# Patient Record
Sex: Female | Born: 1960 | Race: Black or African American | Hispanic: No | Marital: Married | Smoking: Current every day smoker
Health system: Southern US, Community
[De-identification: ages and names within clinical notes are randomized; demographics above are authoritative.]

## PROBLEM LIST (undated history)

## (undated) DIAGNOSIS — I10 Essential (primary) hypertension: Secondary | ICD-10-CM

## (undated) DIAGNOSIS — G8929 Other chronic pain: Secondary | ICD-10-CM

## (undated) DIAGNOSIS — F419 Anxiety disorder, unspecified: Secondary | ICD-10-CM

## (undated) DIAGNOSIS — E7800 Pure hypercholesterolemia, unspecified: Secondary | ICD-10-CM

## (undated) HISTORY — PX: NO PAST SURGERIES: SHX2092

---

## 2006-03-22 ENCOUNTER — Emergency Department (HOSPITAL_COMMUNITY): Admission: EM | Admit: 2006-03-22 | Discharge: 2006-03-22 | Payer: Self-pay | Admitting: Emergency Medicine

## 2006-08-05 ENCOUNTER — Emergency Department (HOSPITAL_COMMUNITY): Admission: EM | Admit: 2006-08-05 | Discharge: 2006-08-05 | Payer: Self-pay | Admitting: Emergency Medicine

## 2006-11-27 ENCOUNTER — Emergency Department (HOSPITAL_COMMUNITY): Admission: EM | Admit: 2006-11-27 | Discharge: 2006-11-27 | Payer: Self-pay | Admitting: Emergency Medicine

## 2009-02-12 ENCOUNTER — Emergency Department (HOSPITAL_COMMUNITY): Admission: EM | Admit: 2009-02-12 | Discharge: 2009-02-12 | Payer: Self-pay | Admitting: Emergency Medicine

## 2009-05-14 ENCOUNTER — Emergency Department (HOSPITAL_COMMUNITY): Admission: EM | Admit: 2009-05-14 | Discharge: 2009-05-15 | Payer: Self-pay | Admitting: Emergency Medicine

## 2010-07-08 ENCOUNTER — Encounter: Admission: RE | Admit: 2010-07-08 | Discharge: 2010-07-08 | Payer: Self-pay | Admitting: Family Medicine

## 2010-08-10 ENCOUNTER — Encounter: Admission: RE | Admit: 2010-08-10 | Discharge: 2010-08-10 | Payer: Self-pay | Admitting: Family Medicine

## 2010-11-07 ENCOUNTER — Encounter: Payer: Self-pay | Admitting: Family Medicine

## 2011-01-23 LAB — COMPREHENSIVE METABOLIC PANEL
Albumin: 3.7 g/dL (ref 3.5–5.2)
Alkaline Phosphatase: 60 U/L (ref 39–117)
BUN: 5 mg/dL — ABNORMAL LOW (ref 6–23)
CO2: 23 mEq/L (ref 19–32)
Chloride: 110 mEq/L (ref 96–112)
Creatinine, Ser: 0.9 mg/dL (ref 0.4–1.2)
GFR calc non Af Amer: >60 mL/min (ref >60)
Potassium: 3.4 mEq/L — ABNORMAL LOW (ref 3.5–5.1)
Total Bilirubin: 0.5 mg/dL (ref 0.3–1.2)

## 2011-01-23 LAB — CBC
HCT: 37.7 % (ref 36.0–46.0)
Hemoglobin: 12.4 g/dL (ref 12.0–15.0)
MCV: 89.9 fL (ref 78.0–100.0)
Platelets: 314 10*3/uL (ref 150–400)
RBC: 4.19 MIL/uL (ref 3.87–5.11)
WBC: 7.9 10*3/uL (ref 4.0–10.5)

## 2011-01-23 LAB — PREGNANCY, URINE: Preg Test, Ur: NEGATIVE

## 2011-01-23 LAB — DIFFERENTIAL
Basophils Absolute: 0 10*3/uL (ref 0.0–0.1)
Basophils Relative: 1 % (ref 0–1)
Eosinophils Relative: 1 % (ref 0–5)
Lymphocytes Relative: 25 % (ref 12–46)
Monocytes Absolute: 0.7 10*3/uL (ref 0.1–1.0)
Neutro Abs: 5.1 10*3/uL (ref 1.7–7.7)

## 2011-01-23 LAB — URINALYSIS, ROUTINE W REFLEX MICROSCOPIC
Bilirubin Urine: NEGATIVE
Ketones, ur: NEGATIVE mg/dL
Nitrite: NEGATIVE
Protein, ur: NEGATIVE mg/dL
Urobilinogen, UA: 0.2 mg/dL (ref 0.0–1.0)
pH: 5.5 (ref 5.0–8.0)

## 2011-01-23 LAB — GC/CHLAMYDIA PROBE AMP, GENITAL: Chlamydia, DNA Probe: NEGATIVE

## 2011-01-23 LAB — URINE MICROSCOPIC-ADD ON

## 2011-01-23 LAB — LIPASE, BLOOD: Lipase: 17 U/L (ref 11–59)

## 2011-03-05 ENCOUNTER — Emergency Department (HOSPITAL_COMMUNITY)
Admission: EM | Admit: 2011-03-05 | Discharge: 2011-03-05 | Discharge status: Against Medical Advice | Payer: Medicaid Other | Attending: Emergency Medicine | Admitting: Emergency Medicine

## 2011-03-05 DIAGNOSIS — R252 Cramp and spasm: Secondary | ICD-10-CM | POA: Insufficient documentation

## 2011-07-04 ENCOUNTER — Other Ambulatory Visit: Payer: Self-pay | Admitting: Family Medicine

## 2011-07-04 DIAGNOSIS — Z1231 Encounter for screening mammogram for malignant neoplasm of breast: Secondary | ICD-10-CM

## 2011-08-12 ENCOUNTER — Ambulatory Visit: Payer: Self-pay

## 2011-08-15 ENCOUNTER — Ambulatory Visit: Payer: Self-pay

## 2011-11-04 ENCOUNTER — Ambulatory Visit: Payer: Medicaid Other

## 2012-03-28 ENCOUNTER — Encounter (HOSPITAL_COMMUNITY): Payer: Self-pay | Admitting: Emergency Medicine

## 2012-03-28 ENCOUNTER — Emergency Department (HOSPITAL_COMMUNITY)
Admission: EM | Admit: 2012-03-28 | Discharge: 2012-03-28 | Disposition: A | Discharge status: Home / Self Care | Payer: Medicaid Other | Attending: Emergency Medicine | Admitting: Emergency Medicine

## 2012-03-28 DIAGNOSIS — Y92009 Unspecified place in unspecified non-institutional (private) residence as the place of occurrence of the external cause: Secondary | ICD-10-CM | POA: Insufficient documentation

## 2012-03-28 DIAGNOSIS — S71109A Unspecified open wound, unspecified thigh, initial encounter: Secondary | ICD-10-CM | POA: Insufficient documentation

## 2012-03-28 DIAGNOSIS — F17200 Nicotine dependence, unspecified, uncomplicated: Secondary | ICD-10-CM | POA: Insufficient documentation

## 2012-03-28 DIAGNOSIS — W540XXA Bitten by dog, initial encounter: Secondary | ICD-10-CM | POA: Insufficient documentation

## 2012-03-28 DIAGNOSIS — Z23 Encounter for immunization: Secondary | ICD-10-CM | POA: Insufficient documentation

## 2012-03-28 DIAGNOSIS — S71009A Unspecified open wound, unspecified hip, initial encounter: Secondary | ICD-10-CM | POA: Insufficient documentation

## 2012-03-28 MED ORDER — AMOXICILLIN-POT CLAVULANATE 875-125 MG PO TABS
1.0000 | ORAL_TABLET | ORAL | Status: AC
  Administered 2012-03-28: 1 via ORAL
  Filled 2012-03-28: qty 1

## 2012-03-28 MED ORDER — TETANUS-DIPHTH-ACELL PERTUSSIS 5-2.5-18.5 LF-MCG/0.5 IM SUSP
0.5000 mL | Freq: Once | INTRAMUSCULAR | Status: AC
  Administered 2012-03-28: 0.5 mL via INTRAMUSCULAR
  Filled 2012-03-28: qty 0.5

## 2012-03-28 MED ORDER — AMOXICILLIN-POT CLAVULANATE 875-125 MG PO TABS: 1.0000 | ORAL_TABLET | Freq: Two times a day (BID) | ORAL | Status: AC

## 2012-03-28 NOTE — ED Provider Notes (Signed)
History   This chart was scribed for Glynn Octave, MD by Charolett Bumpers . The patient was seen in room STRE6/STRE6.    CSN: [id number]  Arrival date & time 03/28/12  1031   First MD Initiated Contact with Patient 03/28/12 1059      Chief Complaint  Patient presents with  . Animal Bite    (Consider location/radiation/quality/duration/timing/severity/associated sxs/prior treatment) HPI Misty Manning is a 51 y.o. female who presents to the Emergency Department complaining of constant, mild animal bite since 1:00 am this morning. Patient states that she stepped on her sleeping dog when he bit her. Patient denies any associated symptoms. Patient states that the dog is hers. Patient states that she believes that the dog had immunizations as a puppy. Patient denies any fever or vomiting. Last tetanus is unknown    History reviewed. No pertinent past medical history.  History reviewed. No pertinent past surgical history.  No family history on file.  History  Substance Use Topics  . Smoking status: Current Everyday Smoker    Types: Cigarettes  . Smokeless tobacco: Not on file  . Alcohol Use: Not on file    OB History    Grav Para Term Preterm Abortions TAB SAB Ect Mult Living                  Review of Systems  Constitutional: Negative for fever and chills.  Respiratory: Negative for shortness of breath.   Gastrointestinal: Negative for nausea and vomiting.  Skin: Positive for wound.  Neurological: Negative for weakness.  All other systems reviewed and are negative.    Allergies  Review of patient's allergies indicates no known allergies.  Home Medications   Current Outpatient Rx  Name Route Sig Dispense Refill  . ALPRAZOLAM 1 MG PO TABS Oral Take 1 mg by mouth 3 (three) times daily.    . CYCLOBENZAPRINE HCL 10 MG PO TABS Oral Take 10 mg by mouth 3 (three) times daily.    Marland Kitchen LISINOPRIL 10 MG PO TABS Oral Take 10 mg by mouth daily.    .  OXYCODONE-ACETAMINOPHEN 10-325 MG PO TABS Oral Take 1 tablet by mouth 2 (two) times daily as needed. For pain      BP 123/74  Pulse 82  Temp 98 F (36.7 C) (Oral)  Resp 18  SpO2 98%  Physical Exam  Nursing note and vitals reviewed. Constitutional: She is oriented to person, place, and time. She appears well-developed and well-nourished. No distress.  HENT:  Head: Normocephalic and atraumatic.  Eyes: EOM are normal.  Neck: Neck supple. No tracheal deviation present.  Cardiovascular: Normal rate.   Pulmonary/Chest: Effort normal. No respiratory distress.  Musculoskeletal: Normal range of motion.  Neurological: She is alert and oriented to person, place, and time.  Skin: Skin is warm and dry.       Tiny puncture to left medial thigh with surrounding erythema.   Psychiatric: She has a normal mood and affect. Her behavior is normal.    ED Course  Procedures (including critical care time)  DIAGNOSTIC STUDIES: Oxygen Saturation is 98% on room air, normal by my interpretation.    COORDINATION OF CARE:  1108: Discussed planned course of treatment with the patient, who is agreeable at this time.     Labs Reviewed - No data to display No results found.   No diagnosis found.    MDM  Dog bite to medial L thigh by own dog.  Dog had shots as puppy but  not sure after that.  Tetanus, wound care, abx  Discussed extremely low risk of rabies with patient given [employer] domestic dog.  She declines rabies prophylaxis.   I personally performed the services described in this documentation, which was scribed in my presence.  The recorded information has been reviewed and considered.       Glynn Octave, MD 03/28/12 1121

## 2012-03-28 NOTE — ED Notes (Signed)
Small puncture wound to left inner thigh after yorkie bit pt in middle of night. No bleeding noted. Pt states dog had vaccines when it was little, but has not taken it to vet in 3-4 years. MD at bedside, pt does not want to have rabies vaccine. Pt also concerned about bone in left shoulder "sticking out". Full range of motion, no pain.

## 2012-03-28 NOTE — Discharge Instructions (Signed)
Animal Bite  An animal bite can result in a scratch on the skin, deep open cut, puncture of the skin, crush injury, or tearing away of the skin or a body part. Dogs are responsible for most animal bites. Children are bitten more often than adults. An animal bite can range from very mild to more serious. A small bite from your house pet is no cause for alarm. However, some animal bites can become infected or injure a bone or other tissue. You must seek medical care if:  · The skin is broken and bleeding does not slow down or stop after 15 minutes.  · The puncture is deep and difficult to clean (such as a cat bite).  · Pain, warmth, redness, or pus develops around the wound.  · The bite is from a stray animal or rodent. There may be a risk of rabies infection.  · The bite is from a snake, raccoon, skunk, fox, coyote, or bat. There may be a risk of rabies infection.  · The person bitten has a chronic illness such as diabetes, liver disease, or cancer, or the person takes medicine that lowers the immune system.  · There is concern about the location and severity of the bite.  It is important to clean and protect an animal bite wound right away to prevent infection. Follow these steps:  · Clean the wound with plenty of water and soap.  · Apply an antibiotic cream.  · Apply gentle pressure over the wound with a clean towel or gauze to slow or stop bleeding.  · Elevate the affected area above the heart to help stop any bleeding.  · Seek medical care. Getting medical care within 8 hours of the animal bite leads to the best possible outcome.  DIAGNOSIS   Your caregiver will most likely:  · Take a detailed history of the animal and the bite injury.  · Perform a wound exam.  · Take your medical history.  Blood tests or X-rays may be performed. Sometimes, infected bite wounds are cultured and sent to a lab to identify the infectious bacteria.   TREATMENT   Medical treatment will depend on the location and type of animal bite as  well as the patient's medical history. Treatment may include:  · Wound care, such as cleaning and flushing the wound with saline solution, bandaging, and elevating the affected area.  · Antibiotics.  · Tetanus immunization.  · Rabies immunization.  · Leaving the wound open to heal. This is often done with animal bites, due to the high risk of infection. However, in certain cases, wound closure with stitches, wound adhesive, skin adhesive strips, or staples may be used.   Infected bites that are left untreated may require intravenous (IV) antibiotics and surgical treatment in the hospital.  HOME CARE INSTRUCTIONS  · Follow your caregiver's instructions for wound care.  · Take all medicines as directed.  · If your caregiver prescribes antibiotics, take them as directed. Finish them even if you start to feel better.  · Follow up with your caregiver for further exams or immunizations as directed.  You may need a tetanus shot if:  · You cannot remember when you had your last tetanus shot.  · You have never had a tetanus shot.  · The injury broke your skin.  If you get a tetanus shot, your arm may swell, get red, and feel warm to the touch. This is common and not a problem. If you need a tetanus   shot and you choose not to have one, there is a rare chance of getting tetanus. Sickness from tetanus can be serious.  SEEK MEDICAL CARE IF:  · You notice warmth, redness, soreness, swelling, pus discharge, or a bad smell coming from the wound.  · You have a red line on the skin coming from the wound.  · You have a fever, chills, or a general ill feeling.  · You have nausea or vomiting.  · You have continued or worsening pain.  · You have trouble moving the injured part.  · You have other questions or concerns.  MAKE SURE YOU:  · Understand these instructions.  · Will watch your condition.  · Will get help right away if you are not doing well or get worse.  Document Released: 06/21/2011 Document Revised: 09/22/2011 Document  Reviewed: 06/21/2011  [employer]® Patient Information ©2012 [employer].

## 2012-03-28 NOTE — ED Notes (Signed)
Pt. Stated, i accidentally stepped on my dog during the night and the dog bit me on the inside of lt thigh.  Dog has not had any shots

## 2012-04-11 ENCOUNTER — Encounter (HOSPITAL_COMMUNITY): Payer: Self-pay

## 2012-04-11 ENCOUNTER — Emergency Department (HOSPITAL_COMMUNITY)
Admission: EM | Admit: 2012-04-11 | Discharge: 2012-04-11 | Disposition: A | Discharge status: Home / Self Care | Payer: Medicaid Other | Source: Home / Self Care

## 2012-04-11 DIAGNOSIS — T63481A Toxic effect of venom of other arthropod, accidental (unintentional), initial encounter: Secondary | ICD-10-CM

## 2012-04-11 DIAGNOSIS — T6391XA Toxic effect of contact with unspecified venomous animal, accidental (unintentional), initial encounter: Secondary | ICD-10-CM

## 2012-04-11 MED ORDER — KETOTIFEN FUMARATE 0.025 % OP SOLN: 1.0000 [drp] | Freq: Two times a day (BID) | OPHTHALMIC | Status: AC

## 2012-04-11 MED ORDER — POLYETHYL GLYCOL-PROPYL GLYCOL 0.4-0.3 % OP GEL: 1.0000 "application " | Freq: Four times a day (QID) | OPHTHALMIC | Status: DC

## 2012-04-11 MED ORDER — TETRACAINE HCL 0.5 % OP SOLN
1.0000 [drp] | Freq: Once | OPHTHALMIC | Status: AC
  Administered 2012-04-11: 1 [drp] via OPHTHALMIC

## 2012-04-11 MED ORDER — PREDNISONE (PAK) 10 MG PO TABS: 50.0000 mg | ORAL_TABLET | Freq: Every day | ORAL | Status: AC

## 2012-04-11 MED ORDER — TETRACAINE HCL 0.5 % OP SOLN
OPHTHALMIC | Status: AC
  Filled 2012-04-11: qty 2
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
## 2012-04-11 NOTE — ED Notes (Signed)
C/o stung by bee 2 days ago; not getting better as quickly as anticipated

## 2012-04-11 NOTE — Discharge Instructions (Signed)
Bee, Wasp, or Hornet Sting   Your caregiver has diagnosed you as having an insect sting. An insect sting appears as a red lump in the skin that sometimes has a tiny hole in the center, or it may have a stinger in the center of the wound. The most common stings are from wasps, hornets and bees.   Individuals have different reactions to insect stings.   A normal reaction may cause pain, swelling, and redness around the sting site.   A localized allergic reaction may cause swelling and redness that extends beyond the sting site.   A large local reaction may continue to develop over the next 12 to 36 hours.   On occasion, the reactions can be severe (anaphylactic reaction). An anaphylactic reaction may cause wheezing; difficulty breathing; chest pain; fainting; raised, itchy, red patches on the skin; a sick feeling to your stomach (nausea); vomiting; cramping; or diarrhea. If you have had an anaphylactic reaction to an insect sting in the past, you are more likely to have one again.   HOME CARE INSTRUCTIONS   With bee stings, a small sac of poison is left in the wound. Brushing across this with something such as a credit card, or anything similar, will help remove this and decrease the amount of the reaction. This same procedure will not help a wasp sting as they do not leave behind a stinger and poison sac.   Apply a cold compress for 10 to 20 minutes every hour for 1 to 2 days, depending on severity, to reduce swelling and itching.   To lessen pain, a paste made of water and baking soda may be rubbed on the bite or sting and left on for 5 minutes.   To relieve itching and swelling, you may use take medication or apply medicated creams or lotions as directed.   Only take over-the-counter or prescription medicines for pain, discomfort, or fever as directed by your caregiver.   Wash the sting site daily with soap and water. Apply antibiotic ointment on the sting site as directed.   If you suffered a severe reaction:   If  you did not require hospitalization, an adult will need to stay with you for 24 hours in case the symptoms return.   You may need to wear a medical bracelet or necklace stating the allergy.   You and your family need to learn when and how to use an anaphylaxis kit or epinephrine injection.   If you have had a severe reaction before, always carry your anaphylaxis kit with you.   SEEK MEDICAL CARE IF:   None of the above helps within 2 to 3 days.   The area becomes red, warm, tender, and swollen beyond the area of the bite or sting.   You have an oral temperature above 102° F (38.9° C).   SEEK IMMEDIATE MEDICAL CARE IF:   You have symptoms of an allergic reaction which are:   Wheezing.   Difficulty breathing.   Chest pain.   Lightheadedness or fainting.   Itchy, raised, red patches on the skin.   Nausea, vomiting, cramping or diarrhea.   ANY OF THESE SYMPTOMS MAY REPRESENT A SERIOUS PROBLEM THAT IS AN EMERGENCY. Do not wait to see if the symptoms will go away. Get medical help right away. Call your local emergency services (911 in [place] DO NOT drive yourself to the hospital.   MAKE SURE YOU:   Understand these instructions.   Will watch your condition.     Will get help right away if you are not doing well or get worse.   Document Released: 10/03/2005 Document Revised: 09/22/2011 Document Reviewed: 03/20/2010   [employer]® Patient Information ©2012 [employer].

## 2012-04-26 ENCOUNTER — Encounter (HOSPITAL_COMMUNITY): Payer: Self-pay | Admitting: Emergency Medicine

## 2012-04-26 ENCOUNTER — Emergency Department (HOSPITAL_COMMUNITY)
Admission: EM | Admit: 2012-04-26 | Discharge: 2012-04-26 | Disposition: A | Discharge status: Home / Self Care | Payer: Medicaid Other | Attending: Emergency Medicine | Admitting: Emergency Medicine

## 2012-04-26 DIAGNOSIS — M79609 Pain in unspecified limb: Secondary | ICD-10-CM | POA: Insufficient documentation

## 2012-04-26 DIAGNOSIS — F17200 Nicotine dependence, unspecified, uncomplicated: Secondary | ICD-10-CM | POA: Insufficient documentation

## 2012-04-26 DIAGNOSIS — M79641 Pain in right hand: Secondary | ICD-10-CM

## 2012-04-26 MED ORDER — HYDROMORPHONE HCL PF 1 MG/ML IJ SOLN
1.0000 mg | Freq: Once | INTRAMUSCULAR | Status: AC
  Administered 2012-04-26: 1 mg via INTRAMUSCULAR
  Filled 2012-04-26: qty 1

## 2012-04-26 MED ORDER — NAPROXEN 500 MG PO TABS: 500.0000 mg | ORAL_TABLET | Freq: Two times a day (BID) | ORAL | Status: AC | PRN

## 2012-04-26 MED ORDER — OXYCODONE-ACETAMINOPHEN 5-325 MG PO TABS: 1.0000 | ORAL_TABLET | ORAL | Status: AC | PRN

## 2012-04-26 MED ORDER — IBUPROFEN 400 MG PO TABS
400.0000 mg | ORAL_TABLET | Freq: Once | ORAL | Status: AC
  Administered 2012-04-26: 400 mg via ORAL
  Filled 2012-04-26: qty 1

## 2012-04-26 MED ORDER — DIAZEPAM 5 MG PO TABS
5.0000 mg | ORAL_TABLET | Freq: Once | ORAL | Status: AC
  Administered 2012-04-26: 5 mg via ORAL
  Filled 2012-04-26: qty 1

## 2012-04-26 NOTE — ED Provider Notes (Signed)
History   This chart was scribed for Raeford Razor, MD by Charolett Bumpers . The patient was seen in room TR05C/TR05C.    CSN: [id number]  Arrival date & time 04/26/12  1419   First MD Initiated Contact with Patient 04/26/12 1448      Chief Complaint  Patient presents with  . Wrist Pain    (Consider location/radiation/quality/duration/timing/severity/associated sxs/prior treatment) HPI Misty Manning is a 51 y.o. female who presents to the Emergency Department complaining of constant Last night, pt states she was sleeping when she noticed the pain. Patient reports associated swelling. Patient states that pain is aggravated with opening and closing her hand. Patient denies any recent injuries. Patient reports a h/o HTN, but denies any h/o diabetes. Pt denies any h/o injury to the right hand. 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 Patient states that she is left handed. Pt denies taking any medications for pain. Patient denies any h/o similar symptoms. Pt denies any other injuries or complaints of pain.    History reviewed. No pertinent past medical history.  History reviewed. No pertinent past surgical history.  History reviewed. No pertinent family history.  History  Substance Use Topics  . Smoking status: Current Everyday Smoker    Types: Cigarettes  . Smokeless tobacco: Not on file  . Alcohol Use: 0.6 oz/week    1 Cans of beer per week    OB History    Grav Para Term Preterm Abortions TAB SAB Ect Mult Living                  Review of Systems  Musculoskeletal: Positive for joint swelling and arthralgias.    Allergies  Review of patient's allergies indicates no known allergies.  Home Medications   Current Outpatient Rx  Name Route Sig Dispense Refill  . ALPRAZOLAM 1 MG PO TABS Oral Take 1 mg by mouth 3 (three) times daily.    . CYCLOBENZAPRINE HCL 10 MG PO TABS Oral Take 10 mg by mouth 3 (three) times daily.    Marland Kitchen LISINOPRIL 10  MG PO TABS Oral Take 10 mg by mouth daily.    . OXYCODONE-ACETAMINOPHEN 10-325 MG PO TABS Oral Take 1 tablet by mouth 2 (two) times daily as needed. For pain    . POLYETHYL GLYCOL-PROPYL GLYCOL 0.4-0.3 % OP GEL Ophthalmic Apply 1 application to eye 4 (four) times daily. 15 mL 0    BP 106/63  Pulse 77  Temp 98.1 F (36.7 C) (Oral)  Resp 18  SpO2 97%  Physical Exam  Nursing note and vitals reviewed. Constitutional: She is oriented to person, place, and time. She appears well-developed and well-nourished. No distress.  HENT:  Head: Normocephalic and atraumatic.  Eyes: EOM are normal.  Neck: Normal range of motion. Neck supple. No tracheal deviation present.  Cardiovascular: Normal rate, regular rhythm and normal heart sounds.   No murmur heard.      Good ulnar and radial pulses of right arm.   Pulmonary/Chest: Effort normal and breath sounds normal. No respiratory distress. She has no wheezes.  Musculoskeletal: Normal range of motion. She exhibits tenderness.       Right hand symmetric as compared to left. No external signs of trauma. Mild tenderness of the palmar aspect of mid right hand. Increased pain with both extension and flexion. No tenderness of flexor sheaths.   Neurological: She is alert and oriented to person, place, and time.  Neurovascularly intact. Mid ulnar and radial nerves intact.   Skin: Skin is warm and dry.       No increased skin warmth. No skin lesions noted.   Psychiatric: She has a normal mood and affect. Her behavior is normal.    ED Course  Procedures (including critical care time)  DIAGNOSTIC STUDIES: Oxygen Saturation is 97% on room air, normal by my interpretation.    COORDINATION OF CARE:  1454: Discussed planned course of treatment with the patient, who is agreeable at this time. Discussed the likelihood of tendontitis Discussed strict return precautions and planned d/c. Discussed f/u with a hand surgeon if symptoms persist. Patient is  agreeable.  1500: Medication Orders: Diazepam (Valium) tablet 5 mg-once; Ibuprofen (Advil, Motrin) tablet 400 mg-once; Hydromorphone (Dilaudid) injection 1 mg-once.    Labs Reviewed - No data to display No results found.   1. Hand pain, right       MDM  51 year old left-hand-dominant female with atraumatic right hand pain. Patient has mild diffuse tenderness over palmar fascia. No swelling noted. No concerning skin changes. No history of recent trauma or remote. Patient is afebrile and well appearing. Patient denies history of diabetes or other immunocompromising states. No significant increase in pain with flexion or extension of the digits. No tenderness along the flexor surfaces of her digits. No swelling noted. Good radial and ulnar pulses and well perfused distally.  This may be a palmar fasciitis or tendinitis. Consider infectious etiology but doubt at this time. Will treat symptomatically. Return precautions were discussed. Patient understands the need for reevaluation for fevers, chills, increasing pain or redness or anything else concerning to her. Hand surgery referral provided if symptoms worsen or fail to improve.    I personally preformed the services scribed in my presence. The recorded information has been reviewed and considered. Raeford Razor, MD.         Raeford Razor, MD 04/26/12 [phone]

## 2012-04-26 NOTE — ED Notes (Signed)
Pt c/o right wrist pain starting today; pt denies obvious injury; pt sts painful to move; CMS intact

## 2012-07-23 ENCOUNTER — Other Ambulatory Visit: Payer: Self-pay | Admitting: Internal Medicine

## 2012-07-23 DIAGNOSIS — Z1231 Encounter for screening mammogram for malignant neoplasm of breast: Secondary | ICD-10-CM

## 2012-08-27 ENCOUNTER — Ambulatory Visit: Payer: Medicaid Other

## 2012-09-19 ENCOUNTER — Ambulatory Visit: Payer: Medicaid Other

## 2012-10-31 ENCOUNTER — Ambulatory Visit: Payer: Medicaid Other

## 2012-12-17 ENCOUNTER — Ambulatory Visit: Payer: Medicaid Other

## 2012-12-18 ENCOUNTER — Ambulatory Visit: Payer: Medicaid Other

## 2013-01-15 ENCOUNTER — Ambulatory Visit
Admission: RE | Admit: 2013-01-15 | Discharge: 2013-01-15 | Disposition: A | Discharge status: Home / Self Care | Payer: Medicaid Other | Source: Ambulatory Visit | Attending: Internal Medicine | Admitting: Internal Medicine

## 2013-01-15 DIAGNOSIS — Z1231 Encounter for screening mammogram for malignant neoplasm of breast: Secondary | ICD-10-CM

## 2013-08-25 ENCOUNTER — Emergency Department (HOSPITAL_COMMUNITY)
Admission: EM | Admit: 2013-08-25 | Discharge: 2013-08-25 | Disposition: A | Discharge status: Home / Self Care | Payer: Medicaid Other | Attending: Emergency Medicine | Admitting: Emergency Medicine

## 2013-08-25 ENCOUNTER — Encounter (HOSPITAL_COMMUNITY): Payer: Self-pay | Admitting: Emergency Medicine

## 2013-08-25 DIAGNOSIS — K089 Disorder of teeth and supporting structures, unspecified: Secondary | ICD-10-CM | POA: Insufficient documentation

## 2013-08-25 DIAGNOSIS — F17200 Nicotine dependence, unspecified, uncomplicated: Secondary | ICD-10-CM | POA: Insufficient documentation

## 2013-08-25 DIAGNOSIS — K0889 Other specified disorders of teeth and supporting structures: Secondary | ICD-10-CM

## 2013-08-25 DIAGNOSIS — L0211 Cutaneous abscess of neck: Secondary | ICD-10-CM | POA: Insufficient documentation

## 2013-08-25 DIAGNOSIS — Z79899 Other long term (current) drug therapy: Secondary | ICD-10-CM | POA: Insufficient documentation

## 2013-08-25 MED ORDER — CLINDAMYCIN HCL 150 MG PO CAPS: 150.0000 mg | ORAL_CAPSULE | Freq: Four times a day (QID) | ORAL | Status: DC

## 2013-08-25 MED ORDER — OXYCODONE-ACETAMINOPHEN 10-325 MG PO TABS: 1.0000 | ORAL_TABLET | Freq: Two times a day (BID) | ORAL | Status: DC | PRN

## 2013-08-25 NOTE — ED Provider Notes (Signed)
Medical screening examination/treatment/procedure(s) were performed by non-physician practitioner and as supervising physician I was immediately available for consultation/collaboration.  EKG Interpretation   None        Geoffery Lyons, MD 08/25/13 1929

## 2013-08-25 NOTE — ED Notes (Signed)
Broke tooth on upper 2nd from back tooth x 3-4 months.  Onset 1 day half dollar size painful knot on back of head to right of midline.

## 2013-08-25 NOTE — ED Notes (Signed)
Pt c/o toothache and a "knot" to back right side of her head. Applied warm compress with no relief.

## 2013-08-25 NOTE — ED Provider Notes (Signed)
CSN: [id number]     Arrival date & time 08/25/13  1722 History  This chart was scribed for non-physician practitioner, Fayrene Helper, PA-C,working with Geoffery Lyons, MD, by Karle Plumber, ED Scribe.  This patient was seen in room TR05C/TR05C and the patient's care was started at 5:29 PM.  Chief Complaint  Patient presents with  . Dental Pain   The history is provided by the patient. No language interpreter was used.   HPI Comments:  Misty Manning is a 52 y.o. female who presents to the Emergency Department complaining of left upper tooth pain onset two days ago. Pt describes her pain as throbbing and states it is 10/10. She reports chewing something when the pain started. She reports brushing her teeth and cold substances make the pain worse. She denies taking anything for pain today. She denies any recent injury to the area. She denies fever. Pt states she has a dentist but is unaware of the name.  Pt also complains of a moderately tender nodule on the right-side of her neck onset 3-4 days ago. She states touching it makes the pain worse. She reports applying warm compresses with no relief. She states her last tetanus vaccination is unknown.   History reviewed. No pertinent past medical history. History reviewed. No pertinent past surgical history. History reviewed. No pertinent family history. History  Substance Use Topics  . Smoking status: Current Every Day Smoker    Types: Cigarettes  . Smokeless tobacco: Not on file  . Alcohol Use: 0.6 oz/week    1 Cans of beer per week   OB History   Grav Para Term Preterm Abortions TAB SAB Ect Mult Living                 Review of Systems  Constitutional: Negative for fever.  HENT: Positive for dental problem. Negative for ear pain and sore throat.   Skin: Negative for rash.       Abscess on back of right side of neck.    Allergies  Review of patient's allergies indicates no known allergies.  Home Medications   Current Outpatient  Rx  Name  Route  Sig  Dispense  Refill  . ALPRAZolam (XANAX) 1 MG tablet   Oral   Take 1 mg by mouth 3 (three) times daily.         . cyclobenzaprine (FLEXERIL) 10 MG tablet   Oral   Take 10 mg by mouth 3 (three) times daily.         Marland Kitchen lisinopril (PRINIVIL,ZESTRIL) 10 MG tablet   Oral   Take 10 mg by mouth daily.         Marland Kitchen oxyCODONE-acetaminophen (PERCOCET) 10-325 MG per tablet   Oral   Take 1 tablet by mouth 2 (two) times daily as needed. For pain         . Polyethyl Glycol-Propyl Glycol (SYSTANE) 0.4-0.3 % GEL   Ophthalmic   Apply 1 application to eye 4 (four) times daily.   15 mL   0    Triage Vitals: BP 113/82  Pulse 110  Temp(Src) 98.9 F (37.2 C) (Oral)  Resp 18  SpO2 97% Physical Exam  Nursing note and vitals reviewed. Constitutional: She is oriented to person, place, and time. She appears well-developed and well-nourished. No distress.  HENT:  Head: Normocephalic and atraumatic.  2nd premolar dental decay. Tender to palpation. No gingival erythema. No obvious abscess. No lympadenopathy.   Eyes: Conjunctivae are normal. No scleral icterus.  Neck:  Neck supple.  Cardiovascular: Normal rate and intact distal pulses.   Pulmonary/Chest: Effort normal. No stridor. No respiratory distress.  Abdominal: Normal appearance.  Neurological: She is alert and oriented to person, place, and time.  Skin: Skin is warm and dry. No rash noted.  Nodular area to posterior cervical region. Indurated and fluctuant. No rash present.   Psychiatric: She has a normal mood and affect. Her behavior is normal.    ED Course  Procedures (including critical care time) DIAGNOSTIC STUDIES: Oxygen Saturation is 97% on RA, normal by my interpretation.   COORDINATION OF CARE: 5:34 PM- Will prescribe antibiotics and pain medication for tooth. Will lance and drain the abscess on neck. Pt verbalizes understanding and agrees to plan.  INCISION AND DRAINAGE Performed by: Fayrene Helper,  PA-C Consent: Verbal consent obtained. Risks and benefits: risks, benefits and alternatives were discussed Type: abscess  Body area: right side of neck  Anesthesia: local infiltration  Incision was made with a scalpel.  Local anesthetic: lidocaine 2% with epinephrine  Anesthetic total: 3 ml  Complexity: complex Blunt dissection to break up loculations  Drainage: purulent  Drainage amount: small  Packing material: 1/4 in iodoform gauze  Patient tolerance: Patient tolerated the procedure well with no immediate complications.  Medications - No data to display  Labs Review Labs Reviewed - No data to display Imaging Review No results found.  EKG Interpretation   None       MDM   1. Cutaneous abscess of neck   2. Pain, dental    BP 113/82  Pulse 110  Temp(Src) 98.9 F (37.2 C) (Oral)  Resp 18  SpO2 97%  I personally performed the services described in this documentation, which was scribed in my presence. The recorded information has been reviewed and is accurate.      Fayrene Helper, PA-C 08/25/13 1810

## 2013-12-17 ENCOUNTER — Other Ambulatory Visit: Payer: Self-pay

## 2013-12-17 DIAGNOSIS — Z1231 Encounter for screening mammogram for malignant neoplasm of breast: Secondary | ICD-10-CM

## 2014-01-16 ENCOUNTER — Ambulatory Visit: Payer: Medicaid Other

## 2014-02-12 ENCOUNTER — Ambulatory Visit: Payer: Medicaid Other

## 2014-02-17 ENCOUNTER — Ambulatory Visit: Payer: Medicaid Other

## 2014-02-18 ENCOUNTER — Ambulatory Visit
Admission: RE | Admit: 2014-02-18 | Discharge: 2014-02-18 | Disposition: A | Discharge status: Home / Self Care | Payer: Medicaid Other | Source: Ambulatory Visit

## 2014-02-18 ENCOUNTER — Encounter (INDEPENDENT_AMBULATORY_CARE_PROVIDER_SITE_OTHER): Payer: Self-pay

## 2014-02-18 DIAGNOSIS — Z1231 Encounter for screening mammogram for malignant neoplasm of breast: Secondary | ICD-10-CM

## 2014-04-03 ENCOUNTER — Emergency Department (HOSPITAL_COMMUNITY)
Admission: EM | Admit: 2014-04-03 | Discharge: 2014-04-03 | Disposition: A | Discharge status: Home / Self Care | Payer: Medicaid Other | Attending: Emergency Medicine | Admitting: Emergency Medicine

## 2014-04-03 ENCOUNTER — Encounter (HOSPITAL_COMMUNITY): Payer: Self-pay | Admitting: Emergency Medicine

## 2014-04-03 DIAGNOSIS — M549 Dorsalgia, unspecified: Secondary | ICD-10-CM | POA: Insufficient documentation

## 2014-04-03 DIAGNOSIS — A5900 Urogenital trichomoniasis, unspecified: Secondary | ICD-10-CM | POA: Insufficient documentation

## 2014-04-03 DIAGNOSIS — N390 Urinary tract infection, site not specified: Secondary | ICD-10-CM | POA: Insufficient documentation

## 2014-04-03 DIAGNOSIS — N760 Acute vaginitis: Secondary | ICD-10-CM | POA: Insufficient documentation

## 2014-04-03 DIAGNOSIS — G8929 Other chronic pain: Secondary | ICD-10-CM | POA: Insufficient documentation

## 2014-04-03 DIAGNOSIS — A499 Bacterial infection, unspecified: Secondary | ICD-10-CM | POA: Insufficient documentation

## 2014-04-03 DIAGNOSIS — A599 Trichomoniasis, unspecified: Secondary | ICD-10-CM

## 2014-04-03 DIAGNOSIS — B9689 Other specified bacterial agents as the cause of diseases classified elsewhere: Secondary | ICD-10-CM | POA: Insufficient documentation

## 2014-04-03 DIAGNOSIS — F17200 Nicotine dependence, unspecified, uncomplicated: Secondary | ICD-10-CM | POA: Insufficient documentation

## 2014-04-03 DIAGNOSIS — Z3202 Encounter for pregnancy test, result negative: Secondary | ICD-10-CM | POA: Insufficient documentation

## 2014-04-03 DIAGNOSIS — Z79899 Other long term (current) drug therapy: Secondary | ICD-10-CM | POA: Insufficient documentation

## 2014-04-03 DIAGNOSIS — F411 Generalized anxiety disorder: Secondary | ICD-10-CM | POA: Insufficient documentation

## 2014-04-03 HISTORY — DX: Anxiety disorder, unspecified: F41.9

## 2014-04-03 HISTORY — DX: Other chronic pain: G89.29

## 2014-04-03 LAB — URINALYSIS, ROUTINE W REFLEX MICROSCOPIC
Bilirubin Urine: NEGATIVE
GLUCOSE, UA: NEGATIVE mg/dL
KETONES UR: NEGATIVE mg/dL
NITRITE: NEGATIVE
PH: 6 (ref 5.0–8.0)
PROTEIN: 30 mg/dL — AB
Specific Gravity, Urine: 1.012 (ref 1.005–1.030)
Urobilinogen, UA: 0.2 mg/dL (ref 0.0–1.0)

## 2014-04-03 LAB — URINE MICROSCOPIC-ADD ON

## 2014-04-03 LAB — WET PREP, GENITAL: YEAST WET PREP: NONE SEEN

## 2014-04-03 LAB — PREGNANCY, URINE: PREG TEST UR: NEGATIVE

## 2014-04-03 MED ORDER — CEPHALEXIN 250 MG PO CAPS
500.0000 mg | ORAL_CAPSULE | Freq: Once | ORAL | Status: AC
  Administered 2014-04-03: 500 mg via ORAL
  Filled 2014-04-03: qty 2

## 2014-04-03 MED ORDER — CEPHALEXIN 500 MG PO CAPS: 500.0000 mg | ORAL_CAPSULE | Freq: Two times a day (BID) | ORAL | Status: DC

## 2014-04-03 MED ORDER — METRONIDAZOLE 500 MG PO TABS
500.0000 mg | ORAL_TABLET | Freq: Once | ORAL | Status: AC
  Administered 2014-04-03: 500 mg via ORAL
  Filled 2014-04-03: qty 1

## 2014-04-03 MED ORDER — METRONIDAZOLE 500 MG PO TABS: 500.0000 mg | ORAL_TABLET | Freq: Two times a day (BID) | ORAL | Status: AC

## 2014-04-03 NOTE — ED Provider Notes (Signed)
CSN: [id number]     Arrival date & time 04/03/14  0843 History   First MD Initiated Contact with Patient 04/03/14 [phone]     Chief Complaint  Patient presents with  . Vaginal Pain  . Dysuria   HPI  Ramandeep Arington is a 53 y.o. female with a PMH of chronic pain and anxiety who presents to the ED for evaluation of vaginal pain and dysuria. History was provided by the patient. Patient states she developed urinary frequency and dysuria yesterday. Also had hematuria early this morning around 1:00 am. She also has vaginal pain but states she recently had a sexual encounter with painful intercourse (due to his large size - per patient). She denies any vaginal bleeding or discharge, genital sores, labia edema, pelvic pain, abdominal pain, nausea, emesis, diarrhea, constipation, fever, chills, change in appetite/activity. Denies hx of OB/GYN concerns or STD's in the past. Has no concerns for STD's. Has chronic lower unchanged back pain.      Past Medical History  Diagnosis Date  . Chronic pain   . Anxiety    Past Surgical History  Procedure Laterality Date  . No past surgeries     No family history on file. History  Substance Use Topics  . Smoking status: Current Every Day Smoker    Types: Cigarettes  . Smokeless tobacco: Not on file  . Alcohol Use: 0.6 oz/week    1 Cans of beer per week   OB History   Grav Para Term Preterm Abortions TAB SAB Ect Mult Living                 Review of Systems  Constitutional: Negative for fever, chills, activity change, appetite change and fatigue.  Gastrointestinal: Negative for nausea, vomiting, abdominal pain, diarrhea and constipation.  Genitourinary: Positive for dysuria, urgency, frequency, hematuria, vaginal pain and dyspareunia. Negative for flank pain, decreased urine volume, vaginal bleeding, vaginal discharge, difficulty urinating, genital sores and pelvic pain.  Musculoskeletal: Positive for back pain (chronic). Negative for arthralgias, gait  problem, joint swelling and myalgias.  Neurological: Negative for dizziness, weakness, light-headedness and headaches.    Allergies  Review of patient's allergies indicates no known allergies.  Home Medications   Prior to Admission medications   Medication Sig Start Date End Date Taking? Authorizing Annasophia Crocker  ALPRAZolam Prudy Feeler) 1 MG tablet Take 1 mg by mouth 3 (three) times daily.    Historical Kynzee Devinney, MD  cyclobenzaprine (FLEXERIL) 10 MG tablet Take 10 mg by mouth 3 (three) times daily.    Historical Alexy Heldt, MD  lisinopril (PRINIVIL,ZESTRIL) 10 MG tablet Take 10 mg by mouth daily.    Historical Zandra Lajeunesse, MD  oxyCODONE-acetaminophen (PERCOCET) 10-325 MG per tablet Take 1 tablet by mouth 2 (two) times daily as needed. For pain 08/25/13   Fayrene Helper, PA-C  Polyethyl Glycol-Propyl Glycol (SYSTANE) 0.4-0.3 % GEL Apply 1 application to eye 4 (four) times daily. 04/11/12   Calvert Cantor, MD   BP 120/79  Pulse 91  Temp(Src) 98.8 F (37.1 C) (Oral)  Resp 18  SpO2 98%  Filed Vitals:   04/03/14 0853  BP: 120/79  Pulse: 91  Temp: 98.8 F (37.1 C)  TempSrc: Oral  Resp: 18  SpO2: 98%   Physical Exam  Nursing note and vitals reviewed. Constitutional: She is oriented to person, place, and time. She appears well-developed and well-nourished. No distress.  HENT:  Head: Normocephalic and atraumatic.  Right Ear: External ear normal.  Left Ear: External ear normal.  Mouth/Throat: Oropharynx  is clear and moist.  Eyes: Conjunctivae are normal. Right eye exhibits no discharge. Left eye exhibits no discharge.  Neck: Normal range of motion. Neck supple.  Cardiovascular: Normal rate, regular rhythm and normal heart sounds.  Exam reveals no gallop and no friction rub.   No murmur heard. Pulmonary/Chest: Effort normal and breath sounds normal. No respiratory distress. She has no wheezes. She has no rales. She exhibits no tenderness.  Abdominal: Soft. Bowel sounds are normal. She exhibits no  distension. There is no tenderness.  Genitourinary:  External genitalia normal with no genital sores/lesions. Minimal white thin discharge in the vaginal vault. No CMT or adnexal tenderness bilaterally. No vaginal bleeding, tears or lacerations.   Musculoskeletal: Normal range of motion. She exhibits no edema and no tenderness.  No CVA, lumbar, or flank tenderness bilaterally.   Neurological: She is alert and oriented to person, place, and time.  Skin: Skin is warm and dry. She is not diaphoretic.    ED Course  Procedures (including critical care time) Labs Review Labs Reviewed  PREGNANCY, URINE  URINALYSIS, ROUTINE W REFLEX MICROSCOPIC    Imaging Review No results found.   EKG Interpretation None      Results for orders placed during the hospital encounter of 04/03/14  WET PREP, GENITAL      Result Value Ref Range   Yeast Wet Prep HPF POC NONE SEEN  NONE SEEN   Trich, Wet Prep FEW (*) NONE SEEN   Clue Cells Wet Prep HPF POC MODERATE (*) NONE SEEN   WBC, Wet Prep HPF POC MODERATE (*) NONE SEEN  PREGNANCY, URINE      Result Value Ref Range   Preg Test, Ur NEGATIVE  NEGATIVE  URINALYSIS, ROUTINE W REFLEX MICROSCOPIC      Result Value Ref Range   Color, Urine STRAW (*) YELLOW   APPearance HAZY (*) CLEAR   Specific Gravity, Urine 1.012  1.005 - 1.030   pH 6.0  5.0 - 8.0   Glucose, UA NEGATIVE  NEGATIVE mg/dL   Hgb urine dipstick LARGE (*) NEGATIVE   Bilirubin Urine NEGATIVE  NEGATIVE   Ketones, ur NEGATIVE  NEGATIVE mg/dL   Protein, ur 30 (*) NEGATIVE mg/dL   Urobilinogen, UA 0.2  0.0 - 1.0 mg/dL   Nitrite NEGATIVE  NEGATIVE   Leukocytes, UA LARGE (*) NEGATIVE  URINE MICROSCOPIC-ADD ON      Result Value Ref Range   Squamous Epithelial / LPF FEW (*) RARE   WBC, UA TOO NUMEROUS TO COUNT  <3 WBC/hpf   RBC / HPF 21-50  <3 RBC/hpf   Bacteria, UA MANY (*) RARE   Urine-Other MUCOUS PRESENT       MDM   Alphonsus SiasLoretta Ratcliffe is a 53 y.o. female with a PMH of chronic pain  and anxiety who presents to the ED for evaluation of vaginal pain and dysuria. Etiology of symptoms likely due to UTI vs trichomonas vs BV infection. UA suggestive of a UTI and patient is symptomatic with dysuria. Doubt pyelonephritis. Abdominal exam benign. No abdominal or pelvic pain. Patient afebrile and non-toxic in appearance. Vital signs stable. Patient also found to have BV and trichomonas infection on wet mount. Pelvic exam benign. Doubt PID. Patient treated with first dose of antibiotics in the ED. Also offered HIV and RPR testing however she refused before discharge. Patient instructed to avoid intercourse until her partner is treated. Also to follow-up for other STD testing. Return precautions, discharge instructions, and follow-up was discussed with the  patient before discharge.     Discharge Medication List as of 04/03/2014 11:30 AM    START taking these medications   Details  cephALEXin (KEFLEX) 500 MG capsule Take 1 capsule (500 mg total) by mouth 2 (two) times daily., Starting 04/03/2014, Until Discontinued, Print    metroNIDAZOLE (FLAGYL) 500 MG tablet Take 1 tablet (500 mg total) by mouth 2 (two) times daily., Starting 04/03/2014, Until Discontinued, Print         Final impressions: 1. UTI (urinary tract infection)   2. Trichomonas infection   3. Bacterial vaginosis       Greer EeJessica Katlin Palmer PA-C            Jillyn LedgerJessica K Palmer, [place] JerseyPA-C 04/03/14 1339

## 2014-04-03 NOTE — Discharge Instructions (Signed)
Take flagyl for bacterial vaginosis and trichomonas infection - do not drink alcohol with this medication Take antibiotic keflex for UTI - take for full dose  Avoid sexual intercourse until your partner is treated  Return to the emergency department if you develop any changing/worsening condition, fever, abdominal pain, or any other concerns (please read additional information regarding your condition below)    Trichomoniasis Trichomoniasis is an infection caused by an organism called Trichomonas. The infection can affect both women and men. In women, the outer female genitalia and the vagina are affected. In men, the penis is mainly affected, but the prostate and other reproductive organs can also be involved. Trichomoniasis is a sexually transmitted infection (STI) and is most often passed to another person through sexual contact.  RISK FACTORS  Having unprotected sexual intercourse.  Having sexual intercourse with an infected partner. SIGNS AND SYMPTOMS  Symptoms of trichomoniasis in women include:  Abnormal gray-green frothy vaginal discharge.  Itching and irritation of the vagina.  Itching and irritation of the area outside the vagina. Symptoms of trichomoniasis in men include:   Penile discharge with or without pain.  Pain during urination. This results from inflammation of the urethra. DIAGNOSIS  Trichomoniasis may be found during a Pap test or physical exam. Your health care provider may use one of the following methods to help diagnose this infection:  Examining vaginal discharge under a microscope. For men, urethral discharge would be examined.  Testing the pH of the vagina with a test tape.  Using a vaginal swab test that checks for the Trichomonas organism. A test is available that provides results within a few minutes.  Doing a culture test for the organism. This is not usually needed. TREATMENT   You may be given medicine to fight the infection. Women should  inform their health care provider if they could be or are pregnant. Some medicines used to treat the infection should not be taken during pregnancy.  Your health care provider may recommend over-the-counter medicines or creams to decrease itching or irritation.  Your sexual partner will need to be treated if infected. HOME CARE INSTRUCTIONS   Take all medicine prescribed by your health care provider.  Take over-the-counter medicine for itching or irritation as directed by your health care provider.  Do not have sexual intercourse while you have the infection.  Women should not douche or wear tampons while they have the infection.  Discuss your infection with your partner. Your partner may have gotten the infection from you, or you may have gotten it from your partner.  Have your sex partner get examined and treated if necessary.  Practice safe, informed, and protected sex.  See your health care provider for other STI testing. SEEK MEDICAL CARE IF:   You still have symptoms after you finish your medicine.  You develop abdominal pain.  You have pain when you urinate.  You have bleeding after sexual intercourse.  You develop a rash.  Your medicine makes you sick or makes you throw up (vomit). Document Released: 03/29/2001 Document Revised: 10/08/2013 Document Reviewed: 07/15/2013 [facility] Patient Information 2015 [place], [place]. This information is not intended to replace advice given to you by your health care provider. Make sure you discuss any questions you have with your health care provider.  Bacterial Vaginosis Bacterial vaginosis is a vaginal infection that occurs when the normal balance of bacteria in the vagina is disrupted. It results from an overgrowth of certain bacteria. This is the most common vaginal infection  in women of childbearing age. Treatment is important to prevent complications, especially in pregnant women, as it can cause a premature delivery. CAUSES    Bacterial vaginosis is caused by an increase in harmful bacteria that are normally present in smaller amounts in the vagina. Several different kinds of bacteria can cause bacterial vaginosis. However, the reason that the condition develops is not fully understood. RISK FACTORS Certain activities or behaviors can put you at an increased risk of developing bacterial vaginosis, including:  Having a new sex partner or multiple sex partners.  Douching.  Using an intrauterine device (IUD) for contraception. Women do not get bacterial vaginosis from toilet seats, bedding, swimming pools, or contact with objects around them. SIGNS AND SYMPTOMS  Some women with bacterial vaginosis have no signs or symptoms. Common symptoms include:  Grey vaginal discharge.  A fishlike odor with discharge, especially after sexual intercourse.  Itching or burning of the vagina and vulva.  Burning or pain with urination. DIAGNOSIS  Your health care provider will take a medical history and examine the vagina for signs of bacterial vaginosis. A sample of vaginal fluid may be taken. Your health care provider will look at this sample under a microscope to check for bacteria and abnormal cells. A vaginal pH test may also be done.  TREATMENT  Bacterial vaginosis may be treated with antibiotic medicines. These may be given in the form of a pill or a vaginal cream. A second round of antibiotics may be prescribed if the condition comes back after treatment.  HOME CARE INSTRUCTIONS   Only take over-the-counter or prescription medicines as directed by your health care provider.  If antibiotic medicine was prescribed, take it as directed. Make sure you finish it even if you start to feel better.  Do not have sex until treatment is completed.  Tell all sexual partners that you have a vaginal infection. They should see their health care provider and be treated if they have problems, such as a mild rash or  itching.  Practice safe sex by using condoms and only having one sex partner. SEEK MEDICAL CARE IF:   Your symptoms are not improving after 3 days of treatment.  You have increased discharge or pain.  You have a fever. MAKE SURE YOU:   Understand these instructions.  Will watch your condition.  Will get help right away if you are not doing well or get worse. FOR MORE INFORMATION  Centers for Disease Control and Prevention, Division of STD Prevention: SolutionApps.co.zawww.cdc.gov/std American Sexual Health Association (ASHA): www.ashastd.org  Document Released: 10/03/2005 Document Revised: 07/24/2013 Document Reviewed: 05/15/2013 [facility] Patient Information 2015 [place], [place]. This information is not intended to replace advice given to you by your health care provider. Make sure you discuss any questions you have with your health care provider.  Urinary Tract Infection Urinary tract infections (UTIs) can develop anywhere along your urinary tract. Your urinary tract is your body's drainage system for removing wastes and extra water. Your urinary tract includes two kidneys, two ureters, a bladder, and a urethra. Your kidneys are a pair of bean-shaped organs. Each kidney is about the size of your fist. They are located below your ribs, one on each side of your spine. CAUSES Infections are caused by microbes, which are microscopic organisms, including fungi, viruses, and bacteria. These organisms are so small that they can only be seen through a microscope. Bacteria are the microbes that most commonly cause UTIs. SYMPTOMS  Symptoms of UTIs may vary by age  and gender of the patient and by the location of the infection. Symptoms in young women typically include a frequent and intense urge to urinate and a painful, burning feeling in the bladder or urethra during urination. Older women and men are more likely to be tired, shaky, and weak and have muscle aches and abdominal pain. A fever may mean the infection  is in your kidneys. Other symptoms of a kidney infection include pain in your back or sides below the ribs, nausea, and vomiting. DIAGNOSIS To diagnose a UTI, your caregiver will ask you about your symptoms. Your caregiver also will ask to provide a urine sample. The urine sample will be tested for bacteria and white blood cells. White blood cells are made by your body to help fight infection. TREATMENT  Typically, UTIs can be treated with medication. Because most UTIs are caused by a bacterial infection, they usually can be treated with the use of antibiotics. The choice of antibiotic and length of treatment depend on your symptoms and the type of bacteria causing your infection. HOME CARE INSTRUCTIONS  If you were prescribed antibiotics, take them exactly as your caregiver instructs you. Finish the medication even if you feel better after you have only taken some of the medication.  Drink enough water and fluids to keep your urine clear or pale yellow.  Avoid caffeine, tea, and carbonated beverages. They tend to irritate your bladder.  Empty your bladder often. Avoid holding urine for long periods of time.  Empty your bladder before and after sexual intercourse.  After a bowel movement, women should cleanse from front to back. Use each tissue only once. SEEK MEDICAL CARE IF:   You have back pain.  You develop a fever.  Your symptoms do not begin to resolve within 3 days. SEEK IMMEDIATE MEDICAL CARE IF:   You have severe back pain or lower abdominal pain.  You develop chills.  You have nausea or vomiting.  You have continued burning or discomfort with urination. MAKE SURE YOU:   Understand these instructions.  Will watch your condition.  Will get help right away if you are not doing well or get worse. Document Released: 07/13/2005 Document Revised: 04/03/2012 Document Reviewed: 11/11/2011 [facility] Patient Information 2015 [place], [place]. This information is not intended to  replace advice given to you by your health care provider. Make sure you discuss any questions you have with your health care provider.

## 2014-04-03 NOTE — ED Notes (Signed)
Pt endorses vaginal pain since 0100 this AM. Burning, urgency and frequency. Pt noticed blood this morning.

## 2014-04-03 NOTE — ED Notes (Signed)
Pt asked for blood work to be drawn. Pt then refused.

## 2014-04-03 NOTE — ED Provider Notes (Signed)
Medical screening examination/treatment/procedure(s) were performed by non-physician practitioner and as supervising physician I was immediately available for consultation/collaboration.   EKG Interpretation None        Junius ArgyleForrest S Harrison, MD 04/03/14 2209

## 2014-04-05 LAB — GC/CHLAMYDIA PROBE AMP
CT Probe RNA: NEGATIVE
GC Probe RNA: NEGATIVE

## 2014-04-05 LAB — URINE CULTURE

## 2015-01-24 ENCOUNTER — Emergency Department (INDEPENDENT_AMBULATORY_CARE_PROVIDER_SITE_OTHER)
Admission: EM | Admit: 2015-01-24 | Discharge: 2015-01-24 | Disposition: A | Discharge status: Home / Self Care | Payer: Medicaid Other | Source: Home / Self Care | Attending: Family Medicine | Admitting: Family Medicine

## 2015-01-24 ENCOUNTER — Encounter (HOSPITAL_COMMUNITY): Payer: Self-pay | Admitting: *Deleted

## 2015-01-24 DIAGNOSIS — M5432 Sciatica, left side: Secondary | ICD-10-CM

## 2015-01-24 HISTORY — DX: Essential (primary) hypertension: I10

## 2015-01-24 HISTORY — DX: Pure hypercholesterolemia, unspecified: E78.00

## 2015-01-24 MED ORDER — KETOROLAC TROMETHAMINE 60 MG/2ML IM SOLN
60.0000 mg | Freq: Once | INTRAMUSCULAR | Status: AC
  Administered 2015-01-24: 60 mg via INTRAMUSCULAR

## 2015-01-24 MED ORDER — METHYLPREDNISOLONE (PAK) 4 MG PO TABS: ORAL_TABLET | ORAL | Status: AC

## 2015-01-24 MED ORDER — FAMOTIDINE 20 MG PO TABS: 20.0000 mg | ORAL_TABLET | Freq: Two times a day (BID) | ORAL | Status: AC

## 2015-01-24 MED ORDER — KETOROLAC TROMETHAMINE 60 MG/2ML IM SOLN
INTRAMUSCULAR | Status: AC
  Filled 2015-01-24: qty 2

## 2015-01-24 MED ORDER — HYDROCODONE-ACETAMINOPHEN 5-325 MG PO TABS: 1.0000 | ORAL_TABLET | Freq: Four times a day (QID) | ORAL | Status: DC | PRN

## 2015-01-24 NOTE — ED Provider Notes (Signed)
CSN: [id number]     Arrival date & time 01/24/15  1732 History   First MD Initiated Contact with Patient 01/24/15 1835     No chief complaint on file.  (Consider location/radiation/quality/duration/timing/severity/associated sxs/prior Treatment) HPI Comments: PCP: [facility] Has tried taking Voltaren and Flexeril from previous prescriptions from PCP with some relief.   Patient is a 54 y.o. female presenting with back pain. The history is provided by the patient.  Back Pain Location:  Sacro-iliac joint Quality:  Shooting and aching Radiates to:  L posterior upper leg, L knee, L foot and L thigh Pain severity:  Moderate Onset quality:  Gradual Duration:  2 weeks Timing:  Constant Progression:  Worsening Chronicity:  New Associated symptoms: leg pain, paresthesias and tingling   Associated symptoms: no abdominal pain, no abdominal swelling, no bladder incontinence, no bowel incontinence, no dysuria, no fever, no numbness, no pelvic pain, no perianal numbness, no weakness and no weight loss   Associated symptoms comment:  States occasional "pins and needles" sensation of LLE   Past Medical History  Diagnosis Date  . Chronic pain   . Anxiety    Past Surgical History  Procedure Laterality Date  . No past surgeries     No family history on file. History  Substance Use Topics  . Smoking status: Current Every Day Smoker    Types: Cigarettes  . Smokeless tobacco: Not on file  . Alcohol Use: 0.6 oz/week    1 Cans of beer per week   OB History    No data available     Review of Systems  Constitutional: Negative for fever and weight loss.  Gastrointestinal: Negative for abdominal pain and bowel incontinence.  Genitourinary: Negative for bladder incontinence, dysuria and pelvic pain.  Musculoskeletal: Positive for back pain.  Neurological: Positive for tingling and paresthesias. Negative for weakness and numbness.  All other systems reviewed and are  negative.   Allergies  Review of patient's allergies indicates no known allergies.  Home Medications   Prior to Admission medications   Medication Sig Start Date End Date Taking? Authorizing Provider  ALPRAZolam Prudy Feeler) 1 MG tablet Take 1 mg by mouth daily.     Historical Provider, MD  cephALEXin (KEFLEX) 500 MG capsule Take 1 capsule (500 mg total) by mouth 2 (two) times daily. 04/03/14   Jillyn Ledger, PA-C  cyclobenzaprine (FLEXERIL) 10 MG tablet Take 10 mg by mouth 3 (three) times daily.    Historical Provider, MD  diclofenac (VOLTAREN) 75 MG EC tablet Take 75 mg by mouth 2 (two) times daily.    Historical Provider, MD  famotidine (PEPCID) 20 MG tablet Take 1 tablet (20 mg total) by mouth 2 (two) times daily. 01/24/15   Ria Clock, PA  HYDROcodone-acetaminophen (NORCO/VICODIN) 5-325 MG per tablet Take 1 tablet by mouth every 6 (six) hours as needed for moderate pain or severe pain. Do not take this medication in combination with Xanax or Flexeril 01/24/15   Mathis Fare Vidhi Delellis, PA  lisinopril (PRINIVIL,ZESTRIL) 10 MG tablet Take 10 mg by mouth daily.    Historical Provider, MD  methylPREDNIsolone (MEDROL DOSPACK) 4 MG tablet follow package directions Do not take in combination with Voltaren 01/24/15   Mathis Fare Desteni Piscopo, PA  metroNIDAZOLE (FLAGYL) 500 MG tablet Take 1 tablet (500 mg total) by mouth 2 (two) times daily. 04/03/14   Jillyn Ledger, PA-C  simvastatin (ZOCOR) 20 MG tablet Take 20 mg by mouth daily.  Historical Provider, MD  triamcinolone cream (KENALOG) 0.5 % Apply 1 application topically 2 (two) times daily.    Historical Provider, MD   BP 119/86 mmHg  Pulse 92  Temp(Src) 98 F (36.7 C) (Oral)  Resp 18  SpO2 100% Physical Exam  Constitutional: She is oriented to person, place, and time. She appears well-developed and well-nourished. No distress.  HENT:  Head: Normocephalic and atraumatic.  Eyes: Conjunctivae are normal.  Cardiovascular: Normal  rate, regular rhythm and normal heart sounds.   Pulmonary/Chest: Effort normal and breath sounds normal.  Abdominal: Soft. Bowel sounds are normal. She exhibits no distension. There is no tenderness.  Musculoskeletal:       Lumbar back: She exhibits tenderness. She exhibits normal range of motion, no bony tenderness, no swelling, no edema, no deformity and no laceration.       Back:  +SLR on left. +point tenderness at left SI joint  Neurological: She is alert and oriented to person, place, and time. She has normal strength. No cranial nerve deficit or sensory deficit. Coordination and gait normal. GCS eye subscore is 4. GCS verbal subscore is 5. GCS motor subscore is 6.  Skin: Skin is warm and dry.  Psychiatric: She has a normal mood and affect. Her behavior is normal.  Nursing note and vitals reviewed.   ED Course  Procedures (including critical care time) Labs Review Labs Reviewed - No data to display  Imaging Review No results found.   MDM   1. Sciatica neuralgia, left    Medrol dose pack as prescribed along with pepcid for prevention of gastritis and PCP follow up if symptoms persist or worsen. Norco (#6) as directed.  [facility] Exam and vital signs are reassuring No clinical evidence of cauda equina syndrome. Mildly asymmetric patellar tendon reflex with right reflex 2/4 and left 2+/4, but no evidence of hyper or hyporeflexia. +SLR on left    Ria ClockJennifer Lee H Jaesean Litzau, [place] 01/24/15 1909

## 2015-01-24 NOTE — ED Notes (Signed)
C/o L leg pain x 2 weeks with leg numbness that comes and goes.  No hx. of same. She called her doctor [place]. and was told to come here and they would try to find someone for her to see.

## 2015-01-24 NOTE — Discharge Instructions (Signed)
Back Exercises Back exercises help treat and prevent back injuries. The goal of back exercises is to increase the strength of your abdominal and back muscles and the flexibility of your back. These exercises should be started when you no longer have back pain. Back exercises include:  Pelvic Tilt. Lie on your back with your knees bent. Tilt your pelvis until the lower part of your back is against the floor. Hold this position 5 to 10 sec and repeat 5 to 10 times.  Knee to Chest. Pull first 1 knee up against your chest and hold for 20 to 30 seconds, repeat this with the other knee, and then both knees. This may be done with the other leg straight or bent, whichever feels better.  Sit-Ups or Curl-Ups. Bend your knees 90 degrees. Start with tilting your pelvis, and do a partial, slow sit-up, lifting your trunk only 30 to 45 degrees off the floor. Take at least 2 to 3 seconds for each sit-up. Do not do sit-ups with your knees out straight. If partial sit-ups are difficult, simply do the above but with only tightening your abdominal muscles and holding it as directed.  Hip-Lift. Lie on your back with your knees flexed 90 degrees. Push down with your feet and shoulders as you raise your hips a couple inches off the floor; hold for 10 seconds, repeat 5 to 10 times.  Back arches. Lie on your stomach, propping yourself up on bent elbows. Slowly press on your hands, causing an arch in your low back. Repeat 3 to 5 times. Any initial stiffness and discomfort should lessen with repetition over time.  Shoulder-Lifts. Lie face down with arms beside your body. Keep hips and torso pressed to floor as you slowly lift your head and shoulders off the floor. Do not overdo your exercises, especially in the beginning. Exercises may cause you some mild back discomfort which lasts for a few minutes; however, if the pain is more severe, or lasts for more than 15 minutes, do not continue exercises until you see your caregiver.  Improvement with exercise therapy for back problems is slow.  See your caregivers for assistance with developing a proper back exercise program. Document Released: 11/10/2004 Document Revised: 12/26/2011 Document Reviewed: 08/04/2011 [facility] Patient Information 2015 [place], [place]. This information is not intended to replace advice given to you by your health care provider. Make sure you discuss any questions you have with your health care provider.  Back Pain, Adult Back pain is very common. The pain often gets better over time. The cause of back pain is usually not dangerous. Most people can learn to manage their back pain on their own.  HOME CARE   Stay active. Start with short walks on flat ground if you can. Try to walk farther each day.  Do not sit, drive, or stand in one place for more than 30 minutes. Do not stay in bed.  Do not avoid exercise or work. Activity can help your back heal faster.  Be careful when you bend or lift an object. Bend at your knees, keep the object close to you, and do not twist.  Sleep on a firm mattress. Lie on your side, and bend your knees. If you lie on your back, put a pillow under your knees.  Only take medicines as told by your doctor.  Put ice on the injured area.  Put ice in a plastic bag.  Place a towel between your skin and the bag.  Leave the  ice on for 15-20 minutes, 03-04 times a day for the first 2 to 3 days. After that, you can switch between ice and heat packs.  Ask your doctor about back exercises or massage.  Avoid feeling anxious or stressed. Find good ways to deal with stress, such as exercise. GET HELP RIGHT AWAY IF:   Your pain does not go away with rest or medicine.  Your pain does not go away in 1 week.  You have new problems.  You do not feel well.  The pain spreads into your legs.  You cannot control when you poop (bowel movement) or pee (urinate).  Your arms or legs feel weak or lose feeling  (numbness).  You feel sick to your stomach (nauseous) or throw up (vomit).  You have belly (abdominal) pain.  You feel like you may pass out (faint). MAKE SURE YOU:   Understand these instructions.  Will watch your condition.  Will get help right away if you are not doing well or get worse. Document Released: 03/21/2008 Document Revised: 12/26/2011 Document Reviewed: 02/04/2014 [facility] Patient Information 2015 [place]. This information is not intended to replace advice given to you by your health care provider. Make sure you discuss any questions you have with your health care provider.  Sciatica Sciatica is pain, weakness, numbness, or tingling along the path of the sciatic nerve. The nerve starts in the lower back and runs down the back of each leg. The nerve controls the muscles in the lower leg and in the back of the knee, while also providing sensation to the back of the thigh, lower leg, and the sole of your foot. Sciatica is a symptom of another medical condition. For instance, nerve damage or certain conditions, such as a herniated disk or bone spur on the spine, pinch or put pressure on the sciatic nerve. This causes the pain, weakness, or other sensations normally associated with sciatica. Generally, sciatica only affects one side of the body. CAUSES   Herniated or slipped disc.  Degenerative disk disease.  A pain disorder involving the narrow muscle in the buttocks (piriformis syndrome).  Pelvic injury or fracture.  Pregnancy.  Tumor (rare). SYMPTOMS  Symptoms can vary from mild to very severe. The symptoms usually travel from the low back to the buttocks and down the back of the leg. Symptoms can include:  Mild tingling or dull aches in the lower back, leg, or hip.  Numbness in the back of the calf or sole of the foot.  Burning sensations in the lower back, leg, or hip.  Sharp pains in the lower back, leg, or hip.  Leg weakness.  Severe back pain  inhibiting movement. These symptoms may get worse with coughing, sneezing, laughing, or prolonged sitting or standing. Also, being overweight may worsen symptoms. DIAGNOSIS  Your caregiver will perform a physical exam to look for common symptoms of sciatica. He or she may ask you to do certain movements or activities that would trigger sciatic nerve pain. Other tests may be performed to find the cause of the sciatica. These may include:  Blood tests.  X-rays.  Imaging tests, such as an MRI or CT scan. TREATMENT  Treatment is directed at the cause of the sciatic pain. Sometimes, treatment is not necessary and the pain and discomfort goes away on its own. If treatment is needed, your caregiver may suggest:  Over-the-counter medicines to relieve pain.  Prescription medicines, such as anti-inflammatory medicine, muscle relaxants, or narcotics.  Applying heat or  ice to the painful area.  Steroid injections to lessen pain, irritation, and inflammation around the nerve.  Reducing activity during periods of pain.  Exercising and stretching to strengthen your abdomen and improve flexibility of your spine. Your caregiver may suggest losing weight if the extra weight makes the back pain worse.  Physical therapy.  Surgery to eliminate what is pressing or pinching the nerve, such as a bone spur or part of a herniated disk. HOME CARE INSTRUCTIONS   Only take over-the-counter or prescription medicines for pain or discomfort as directed by your caregiver.  Apply ice to the affected area for 20 minutes, 3-4 times a day for the first 48-72 hours. Then try heat in the same way.  Exercise, stretch, or perform your usual activities if these do not aggravate your pain.  Attend physical therapy sessions as directed by your caregiver.  Keep all follow-up appointments as directed by your caregiver.  Do not wear high heels or shoes that do not provide proper support.  Check your mattress to see if it  is too soft. A firm mattress may lessen your pain and discomfort. SEEK IMMEDIATE MEDICAL CARE IF:   You lose control of your bowel or bladder (incontinence).  You have increasing weakness in the lower back, pelvis, buttocks, or legs.  You have redness or swelling of your back.  You have a burning sensation when you urinate.  You have pain that gets worse when you lie down or awakens you at night.  Your pain is worse than you have experienced in the past.  Your pain is lasting longer than 4 weeks.  You are suddenly losing weight without reason. MAKE SURE YOU:  Understand these instructions.  Will watch your condition.  Will get help right away if you are not doing well or get worse. Document Released: 09/27/2001 Document Revised: 04/03/2012 Document Reviewed: 02/12/2012 [facility] Patient Information 2015 [place], [place]. This information is not intended to replace advice given to you by your health care provider. Make sure you discuss any questions you have with your health care provider.

## 2015-02-02 ENCOUNTER — Other Ambulatory Visit: Payer: Self-pay

## 2015-02-02 DIAGNOSIS — Z1231 Encounter for screening mammogram for malignant neoplasm of breast: Secondary | ICD-10-CM

## 2015-02-24 ENCOUNTER — Ambulatory Visit: Payer: Medicaid Other

## 2015-04-06 ENCOUNTER — Encounter (HOSPITAL_COMMUNITY): Payer: Self-pay | Admitting: Family Medicine

## 2015-04-06 ENCOUNTER — Emergency Department (HOSPITAL_COMMUNITY)
Admission: EM | Admit: 2015-04-06 | Discharge: 2015-04-06 | Disposition: A | Discharge status: Home / Self Care | Payer: Medicaid Other | Attending: Emergency Medicine | Admitting: Emergency Medicine

## 2015-04-06 ENCOUNTER — Emergency Department (HOSPITAL_COMMUNITY): Payer: Medicaid Other

## 2015-04-06 DIAGNOSIS — Z79899 Other long term (current) drug therapy: Secondary | ICD-10-CM | POA: Diagnosis not present

## 2015-04-06 DIAGNOSIS — L03113 Cellulitis of right upper limb: Secondary | ICD-10-CM | POA: Diagnosis not present

## 2015-04-06 DIAGNOSIS — R509 Fever, unspecified: Secondary | ICD-10-CM | POA: Diagnosis not present

## 2015-04-06 DIAGNOSIS — E780 Pure hypercholesterolemia: Secondary | ICD-10-CM | POA: Diagnosis not present

## 2015-04-06 DIAGNOSIS — Z720 Tobacco use: Secondary | ICD-10-CM | POA: Insufficient documentation

## 2015-04-06 DIAGNOSIS — Y9389 Activity, other specified: Secondary | ICD-10-CM | POA: Insufficient documentation

## 2015-04-06 DIAGNOSIS — I10 Essential (primary) hypertension: Secondary | ICD-10-CM | POA: Diagnosis not present

## 2015-04-06 DIAGNOSIS — G8929 Other chronic pain: Secondary | ICD-10-CM | POA: Diagnosis not present

## 2015-04-06 DIAGNOSIS — M7041 Prepatellar bursitis, right knee: Secondary | ICD-10-CM

## 2015-04-06 DIAGNOSIS — L02413 Cutaneous abscess of right upper limb: Secondary | ICD-10-CM | POA: Diagnosis present

## 2015-04-06 DIAGNOSIS — Z792 Long term (current) use of antibiotics: Secondary | ICD-10-CM | POA: Diagnosis not present

## 2015-04-06 DIAGNOSIS — L02412 Cutaneous abscess of left axilla: Secondary | ICD-10-CM | POA: Diagnosis not present

## 2015-04-06 DIAGNOSIS — R110 Nausea: Secondary | ICD-10-CM | POA: Insufficient documentation

## 2015-04-06 DIAGNOSIS — R197 Diarrhea, unspecified: Secondary | ICD-10-CM | POA: Insufficient documentation

## 2015-04-06 DIAGNOSIS — Z7952 Long term (current) use of systemic steroids: Secondary | ICD-10-CM | POA: Insufficient documentation

## 2015-04-06 DIAGNOSIS — F419 Anxiety disorder, unspecified: Secondary | ICD-10-CM | POA: Insufficient documentation

## 2015-04-06 LAB — CBC WITH DIFFERENTIAL/PLATELET
BASOS ABS: 0 10*3/uL (ref 0.0–0.1)
Basophils Relative: 0 % (ref 0–1)
EOS ABS: 0.1 10*3/uL (ref 0.0–0.7)
Eosinophils Relative: 1 % (ref 0–5)
HEMATOCRIT: 34 % — AB (ref 36.0–46.0)
HEMOGLOBIN: 11.6 g/dL — AB (ref 12.0–15.0)
LYMPHS ABS: 2.4 10*3/uL (ref 0.7–4.0)
Lymphocytes Relative: 19 % (ref 12–46)
MCH: 29.4 pg (ref 26.0–34.0)
MCHC: 34.1 g/dL (ref 30.0–36.0)
MCV: 86.1 fL (ref 78.0–100.0)
Monocytes Absolute: 1.1 10*3/uL — ABNORMAL HIGH (ref 0.1–1.0)
Monocytes Relative: 8 % (ref 3–12)
NEUTROS PCT: 72 % (ref 43–77)
Neutro Abs: 9.1 10*3/uL — ABNORMAL HIGH (ref 1.7–7.7)
Platelets: 295 10*3/uL (ref 150–400)
RBC: 3.95 MIL/uL (ref 3.87–5.11)
RDW: 12.5 % (ref 11.5–15.5)
WBC: 12.7 10*3/uL — ABNORMAL HIGH (ref 4.0–10.5)

## 2015-04-06 LAB — COMPREHENSIVE METABOLIC PANEL
ALBUMIN: 3.3 g/dL — AB (ref 3.5–5.0)
ALK PHOS: 74 U/L (ref 38–126)
ALT: 14 U/L (ref 14–54)
AST: 16 U/L (ref 15–41)
Anion gap: 8 (ref 5–15)
BILIRUBIN TOTAL: 0.8 mg/dL (ref 0.3–1.2)
BUN: 7 mg/dL (ref 6–20)
CHLORIDE: 107 mmol/L (ref 101–111)
CO2: 27 mmol/L (ref 22–32)
Calcium: 9.6 mg/dL (ref 8.9–10.3)
Creatinine, Ser: 0.85 mg/dL (ref 0.44–1.00)
GFR calc Af Amer: >60 mL/min (ref >60)
GFR calc non Af Amer: >60 mL/min (ref >60)
Glucose, Bld: 108 mg/dL — ABNORMAL HIGH (ref 65–99)
POTASSIUM: 3.8 mmol/L (ref 3.5–5.1)
SODIUM: 142 mmol/L (ref 135–145)
TOTAL PROTEIN: 7.4 g/dL (ref 6.5–8.1)

## 2015-04-06 MED ORDER — CEPHALEXIN 250 MG PO CAPS
500.0000 mg | ORAL_CAPSULE | Freq: Once | ORAL | Status: AC
  Administered 2015-04-06: 500 mg via ORAL
  Filled 2015-04-06: qty 2

## 2015-04-06 MED ORDER — LIDOCAINE-EPINEPHRINE (PF) 2 %-1:200000 IJ SOLN
10.0000 mL | Freq: Once | INTRAMUSCULAR | Status: AC
  Administered 2015-04-06: 10 mL via INTRADERMAL
  Filled 2015-04-06: qty 20

## 2015-04-06 MED ORDER — CEPHALEXIN 500 MG PO CAPS: 500.0000 mg | ORAL_CAPSULE | Freq: Four times a day (QID) | ORAL | Status: AC

## 2015-04-06 MED ORDER — FENTANYL CITRATE (PF) 100 MCG/2ML IJ SOLN
25.0000 ug | Freq: Once | INTRAMUSCULAR | Status: AC
  Administered 2015-04-06: 25 ug via INTRAVENOUS
  Filled 2015-04-06: qty 2

## 2015-04-06 MED ORDER — SULFAMETHOXAZOLE-TRIMETHOPRIM 800-160 MG PO TABS
1.0000 | ORAL_TABLET | Freq: Once | ORAL | Status: AC
  Administered 2015-04-06: 1 via ORAL
  Filled 2015-04-06: qty 1

## 2015-04-06 MED ORDER — SULFAMETHOXAZOLE-TRIMETHOPRIM 800-160 MG PO TABS: 1.0000 | ORAL_TABLET | Freq: Two times a day (BID) | ORAL | Status: AC

## 2015-04-06 MED ORDER — ONDANSETRON HCL 4 MG PO TABS
4.0000 mg | ORAL_TABLET | Freq: Once | ORAL | Status: AC
  Administered 2015-04-06: 4 mg via ORAL
  Filled 2015-04-06: qty 1

## 2015-04-06 MED ORDER — HYDROCODONE-ACETAMINOPHEN 5-325 MG PO TABS: 1.0000 | ORAL_TABLET | ORAL | Status: AC | PRN

## 2015-04-06 NOTE — ED Notes (Signed)
Declined W/C at D/C and was escorted to lobby by RN. 

## 2015-04-06 NOTE — ED Notes (Signed)
PT reports having diarrhea and fevers to PA.

## 2015-04-06 NOTE — ED Notes (Signed)
Pt here with abscess to right elbow, right knee and left axilla. Right elbow draining.

## 2015-04-06 NOTE — Discharge Instructions (Signed)
Bursitis Bursitis is when the fluid-filled sac (bursa) that covers and protects a joint gets puffy and irritated. The elbow, shoulder, hip, and knee joints are most often affected. HOME CARE  Put ice on the area.  Put ice in a plastic bag.  Place a towel between your skin and the bag.  Leave the ice on for 15-20 minutes, 03-04 times a day.  Put the joint through a full range of motion 4 times a day. Rest the injured joint at other times. When you have less pain, begin slow movements and usual activities.  Only take medicine as told by your doctor.  Follow up with your doctor. Any delay in care could stop the bursitis from healing. This could cause long-term pain. GET HELP RIGHT AWAY IF:   You have more pain with treatment.  You have a temperature by mouth above 102 F (38.9 C), not controlled by medicine.  You have heat and irritation over the fluid-filled sac. MAKE SURE YOU:   Understand these instructions.  Will watch your condition.  Will get help right away if you are not doing well or get worse. Document Released: 03/23/2010 Document Revised: 12/26/2011 Document Reviewed: 12/23/2013 [facility] Patient Information 2015 [place], [place]. This information is not intended to replace advice given to you by your health care provider. Make sure you discuss any questions you have with your health care provider.  Abscess Care After An abscess (also called a boil or furuncle) is an infected area that contains a collection of pus. Signs and symptoms of an abscess include pain, tenderness, redness, or hardness, or you may feel a moveable soft area under your skin. An abscess can occur anywhere in the body. The infection may spread to surrounding tissues causing cellulitis. A cut (incision) by the surgeon was made over your abscess and the pus was drained out. Gauze may have been packed into the space to provide a drain that will allow the cavity to heal from the inside outwards. The boil  may be painful for 5 to 7 days. Most people with a boil do not have high fevers. Your abscess, if seen early, may not have localized, and may not have been lanced. If not, another appointment may be required for this if it does not get better on its own or with medications. HOME CARE INSTRUCTIONS   Only take over-the-counter or prescription medicines for pain, discomfort, or fever as directed by your caregiver.  When you bathe, soak and then remove gauze or iodoform packs at least daily or as directed by your caregiver. You may then wash the wound gently with mild soapy water. Repack with gauze or do as your caregiver directs. SEEK IMMEDIATE MEDICAL CARE IF:   You develop increased pain, swelling, redness, drainage, or bleeding in the wound site.  You develop signs of generalized infection including muscle aches, chills, fever, or a general ill feeling.  An oral temperature above 102 F (38.9 C) develops, not controlled by medication. See your caregiver for a recheck if you develop any of the symptoms described above. If medications (antibiotics) were prescribed, take them as directed. Document Released: 04/21/2005 Document Revised: 12/26/2011 Document Reviewed: 12/17/2007 [facility] Patient Information 2015 [place], [place]. This information is not intended to replace advice given to you by your health care provider. Make sure you discuss any questions you have with your health care provider.  Cellulitis Cellulitis is an infection of the skin and the tissue under the skin. The infected area is usually red  and tender. This happens most often in the arms and lower legs. HOME CARE   Take your antibiotic medicine as told. Finish the medicine even if you start to feel better.  Keep the infected arm or leg raised (elevated).  Put a warm cloth on the area up to 4 times per day.  Only take medicines as told by your doctor.  Keep all doctor visits as told. GET HELP IF:  You see red streaks on  the skin coming from the infected area.  Your red area gets bigger or turns a dark color.  Your bone or joint under the infected area is painful after the skin heals.  Your infection comes back in the same area or different area.  You have a puffy (swollen) bump in the infected area.  You have new symptoms.  You have a fever. GET HELP RIGHT AWAY IF:   You feel very sleepy.  You throw up (vomit) or have watery poop (diarrhea).  You feel sick and have muscle aches and pains. MAKE SURE YOU:   Understand these instructions.  Will watch your condition.  Will get help right away if you are not doing well or get worse. Document Released: 03/21/2008 Document Revised: 02/17/2014 Document Reviewed: 12/19/2011 [facility] Patient Information 2015 [place], [place]. This information is not intended to replace advice given to you by your health care provider. Make sure you discuss any questions you have with your health care provider.

## 2015-04-06 NOTE — ED Notes (Signed)
Pt was able to complete orthostatic vital signs with no assistance needed.  

## 2015-04-06 NOTE — ED Provider Notes (Signed)
CSN: [id number]     Arrival date & time 04/06/15  1445 History  This chart was scribed for non-physician practitioner, Danelle Berry, PA-C, working with Pricilla Loveless, MD by Charline Bills, ED Scribe. This patient was seen in room TR11C/TR11C and the patient's care was started at 3:42 PM.   Chief Complaint  Patient presents with  . Abscess   The history is provided by the patient. No language interpreter was used.   HPI Comments: Misty Manning is a 54 y.o. female who presents to the Emergency Department complaining of multiple abscesses to her right elbow and left axilla for the past 2 weeks. Pt was able to "milk" her right arm, from bumps in her armpit down to her elbow, and "mashing on it" with her daughter, was able to cause it to drain yellow purulence, and has since been draining clear and yellow fluid.  She has had redness, swelling and warmth to her entire right arm for 1 to 2 weeks. Pt reports gradually worsening swelling to right elbow last night, moderate pain to the area that is exacerbated with touching, diarrhea x1 week with her last episode being this morning night, nausea x1 week, subjective fever and chills onset last night.  She denies vomiting, loss of appetite, abdominal pain, chest pain, syncope. 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 No recent antibiotic use. No known allergies, and use of OTC medicine, but does not know the name of it.   Pt has also presents with gradually worsening right knee swelling for the past week, that began after cleaning floors on her knees. There is swelling, without heat or redness.  Pt reports associated mild knee pain that is exacerbated with ambulating and kneeling.  Past Medical History  Diagnosis Date  . Chronic pain   . Anxiety   . Hypertension   . High cholesterol    Past Surgical History  Procedure Laterality Date  . No past surgeries     Family History  Problem  Relation Age of Onset  . Hypertension Father   . Alcoholism Father    History  Substance Use Topics  . Smoking status: Current Every Day Smoker -- 0.33 packs/day    Types: Cigarettes  . Smokeless tobacco: Not on file  . Alcohol Use: 2.4 oz/week    4 Cans of beer per week     Comment: 4 40 oz cans /week   OB History    No data available     Review of Systems  Constitutional: Positive for fever and chills. Negative for diaphoresis, appetite change and fatigue.  HENT: Negative.   Eyes: Negative.   Respiratory: Negative for cough, chest tightness, shortness of breath and wheezing.   Cardiovascular: Negative for chest pain and palpitations.  Gastrointestinal: Positive for nausea and diarrhea. Negative for vomiting, abdominal pain and abdominal distention.  Genitourinary: Negative.   Skin:       + Abscess  Neurological: Positive for light-headedness. Negative for dizziness, syncope, weakness and headaches.   Allergies  Review of patient's allergies indicates no known allergies.  Home Medications   Prior to Admission medications   Medication Sig Start Date End Date Taking? Authorizing Provider  ALPRAZolam Prudy Feeler) 1 MG tablet Take 1 mg by mouth daily as needed.     Historical Provider, MD  cephALEXin (KEFLEX) 500 MG capsule Take 1 capsule (500 mg total) by mouth 4 (four) times daily. 04/06/15   Danelle Berry, PA-C  cyclobenzaprine (FLEXERIL) 10 MG tablet Take 10 mg by mouth 3 (three) times daily.    Historical Provider, MD  diclofenac (VOLTAREN) 75 MG EC tablet Take 75 mg by mouth 2 (two) times daily.    Historical Provider, MD  famotidine (PEPCID) 20 MG tablet Take 1 tablet (20 mg total) by mouth 2 (two) times daily. 01/24/15   Ria Clock, PA  HYDROcodone-acetaminophen (NORCO/VICODIN) 5-325 MG per tablet Take 1-2 tablets by mouth every 4 (four) hours as needed for severe pain. 04/06/15   Danelle Berry, PA-C  lisinopril (PRINIVIL,ZESTRIL) 10 MG tablet Take 10 mg by mouth daily.     Historical Provider, MD  methylPREDNIsolone (MEDROL DOSPACK) 4 MG tablet follow package directions Do not take in combination with Voltaren 01/24/15   Mathis Fare Presson, PA  metroNIDAZOLE (FLAGYL) 500 MG tablet Take 1 tablet (500 mg total) by mouth 2 (two) times daily. 04/03/14   Jillyn Ledger, PA-C  simvastatin (ZOCOR) 20 MG tablet Take 20 mg by mouth daily.    Historical Provider, MD  sulfamethoxazole-trimethoprim (BACTRIM DS,SEPTRA DS) 800-160 MG per tablet Take 1 tablet by mouth 2 (two) times daily. 04/06/15 04/13/15  Danelle Berry, PA-C  triamcinolone cream (KENALOG) 0.5 % Apply 1 application topically 2 (two) times daily.    Historical Provider, MD   BP 118/67 mmHg  Pulse 91  Temp(Src) 98.2 F (36.8 C) (Oral)  Resp 18  Ht  (1.651 m)  Wt 144 lb 6 oz (65.488 kg)  BMI 24.03 kg/m2  SpO2 100% Physical Exam  Constitutional: She is oriented to person, place, and time. She appears well-developed and well-nourished. No distress.  HENT:  Head: Normocephalic and atraumatic.  Nose: Nose normal.  Mouth/Throat: Oropharynx is clear and moist and mucous membranes are normal. Mucous membranes are not pale, not dry and not cyanotic.  Eyes: Conjunctivae and EOM are normal. Pupils are equal, round, and reactive to light. Right eye exhibits no discharge. Left eye exhibits no discharge. No scleral icterus.  Neck: Normal range of motion. Neck supple. No JVD present. No tracheal deviation present.  Cardiovascular: Normal rate, regular rhythm and normal heart sounds.  Exam reveals no gallop and no friction rub.   No murmur heard. Pulmonary/Chest: Effort normal and breath sounds normal. No respiratory distress. She has no wheezes. She has no rales. She exhibits no tenderness.  Abdominal: Soft. Bowel sounds are normal. She exhibits no distension and no mass. There is no tenderness. There is no rebound and no guarding.  Musculoskeletal: Normal range of motion.       Right elbow: She exhibits  swelling. Tenderness found. Olecranon process tenderness noted. No radial head, no medial epicondyle and no lateral epicondyle tenderness noted.       Left elbow: She exhibits normal range of motion, no swelling, no effusion and no deformity. No tenderness found.       Right knee: She exhibits swelling, effusion and deformity. She exhibits normal range of motion, no ecchymosis, no laceration, no erythema, normal alignment, no LCL laxity, normal patellar mobility, no bony tenderness, normal meniscus and no MCL laxity. No tenderness found. No medial joint line, no lateral joint line, no MCL, no LCL and no patellar tendon tenderness noted.  Right elbow swollen with erythema extending up arm  Small lesion/opening in rough, dry elbow dermis, with red center and serous drainage. Left elbow has hyperpigmented/scarred skin over elbow Right knee subcutaneous prepatella bursitis, no tenderness to palpation of patella tendon or quadricept tendon,  no suprapatellar/infrapatellar bursitis (no effusion, swelling or tenderness)  Lymphadenopathy:       Right axillary: No pectoral and no lateral adenopathy present.  Neurological: She is alert and oriented to person, place, and time. She exhibits normal muscle tone. Coordination normal.  Skin: Skin is warm and dry. Lesion noted. No rash noted. She is not diaphoretic. There is erythema. No cyanosis. No pallor. Nails show no clubbing.     Psychiatric: She has a normal mood and affect. Her behavior is normal.  Nursing note and vitals reviewed.  ED Course  Procedures (including critical care time) DIAGNOSTIC STUDIES: Oxygen Saturation is 100% on RA, normal by my interpretation.    COORDINATION OF CARE: 3:55 PM-Discussed treatment plan with pt at bedside and pt agreed to plan.   Labs Review Labs Reviewed  COMPREHENSIVE METABOLIC PANEL - Abnormal; Notable for the following:    Glucose, Bld 108 (*)    Albumin 3.3 (*)    All other components within normal limits   CBC WITH DIFFERENTIAL/PLATELET - Abnormal; Notable for the following:    WBC 12.7 (*)    Hemoglobin 11.6 (*)    HCT 34.0 (*)    Neutro Abs 9.1 (*)    Monocytes Absolute 1.1 (*)    All other components within normal limits    Imaging Review Dg Elbow Complete Right  04/06/2015   CLINICAL DATA:  Multiple abscesses at RIGHT elbow for 2 weeks, clear yellow drainage, redness and increased warmth last night gradually worsening swelling RIGHT elbow last night, moderate pain  EXAM: RIGHT ELBOW - COMPLETE 3+ VIEW  COMPARISON:  None  FINDINGS: Soft tissue swelling greatest dorsally as well as medially.  Osseous mineralization normal.  Joint spaces preserved.  No acute fracture, dislocation or bone destruction.  No elbow joint effusion.  IMPRESSION: Soft tissue swelling without acute osseous findings.   Electronically Signed   By: Ulyses Southward M.D.   On: 04/06/2015 16:54     EKG Interpretation None       EMERGENCY DEPARTMENT US SOFT TISSUE INTERPRETATION "Study: Limited Ultrasound of the noted body part in comments below"  INDICATIONS: Soft tissue infection Multiple views of the body part are obtained with a multi-frequency linear probe  PERFORMED BY:  Myself  IMAGES ARCHIVED?: Yes  SIDE:Right   BODY PART:Upper extremity  FINDINGS: No abcess noted and Cellulitis present  LIMITATIONS:  none  INTERPRETATION:  No abcess noted and Cellulitis present  COMMENT:      INCISION AND DRAINAGE Performed by: Danelle Berry Consent: Verbal consent obtained. Risks and benefits: risks, benefits and alternatives were discussed Time out performed prior to procedure Type: cellulitis/abscess Body area: right elbow Anesthesia: local infiltration Incision was made with a scalpel. Local anesthetic: lidocaine 2% w epinephrine Anesthetic total: 3 ml Complexity: simple  Drainage: serosanguinous  Drainage amount: scant Packing material: none Patient tolerance: Patient tolerated the procedure well  with no immediate complications.  Incision was made to extend opening that is currently draining.  Wound dressed.     MDM   Final diagnoses:  Cellulitis of right upper extremity  Prepatellar bursitis, right    Pt briefly seen in FT 11 - believe not appropriate for FT, has multiple "abscesses" - cellulitis and drainage of right elbow, history of the same in the left, a prepatella bursitis in the right knee after kneeling to clean floors, and systemic sx of nausea, diarrhea for 1 week and fevers, chills and lightheadedness for 2 days.   Vital signs stable -  not tachycardic, not hypotensive, and afebrile  Will order CMP, CBC, orthostatics to evaluate need for IVF  Right elbow has spreading redness and erythema with broken skin and serous drainage. Will need Abx at least, ordered xray to evaluate the right elbow.  Right prepatellar effusion - knee joint is stable, no tenderness to palpation to all patellar ligaments, but pt cannot kneel, minimal pain with ambulation.  4:53 PM Danelle Berry, PA-C  Magda Paganini - RN is trying to transfer to main ED, hold for now.  6:09 PM Pt has not been transferred to elsewhere in ED, labs show mild leukocytosis, xray show soft tissue swelling.  Dr. Criss Alvine has seen pt and plan is to send pt to ortho for drainage of prepatellar bursitis, it does not appear infected at this time and risk>benefit of aspiration in the ED. Right arm cellulitis has been evaluated by ultrasound, which shows diffuse cobble-stoning, but no discreet, drainable abscess.  Will extend opening with local anesthesia, dress wound, and prescribe bactrim/keflex for tx of cellulitis. Danelle Berry, PA-C   Medications  fentaNYL (SUBLIMAZE) injection 25 mcg (25 mcg Intravenous Given 04/06/15 1814)  lidocaine-EPINEPHrine (XYLOCAINE W/EPI) 2 %-1:200000 (PF) injection 10 mL (10 mLs Intradermal Given 04/06/15 1814)  sulfamethoxazole-trimethoprim (BACTRIM DS,SEPTRA DS) 800-160 MG per tablet 1 tablet (1  tablet Oral Given 04/06/15 1830)  cephALEXin (KEFLEX) capsule 500 mg (500 mg Oral Given 04/06/15 1830)  ondansetron (ZOFRAN) tablet 4 mg (4 mg Oral Given 04/06/15 1831)   Filed Vitals:   04/06/15 1525 04/06/15 1838  BP: 118/67 122/64  Pulse: 91 84  Temp: 98.2 F (36.8 C) 98.6 F (37 C)  TempSrc: Oral Oral  Resp: 18 16  Height:  (1.651 m)   Weight: 144 lb 6 oz (65.488 kg)   SpO2: 100%        I personally performed the services described in this documentation, which was scribed in my presence. The recorded information has been reviewed and is accurate.    Danelle Berry, PA-C 04/06/15 1846  Pricilla Loveless, MD 04/08/15 [phone]

## 2015-09-15 ENCOUNTER — Ambulatory Visit
Admission: RE | Admit: 2015-09-15 | Discharge: 2015-09-15 | Disposition: A | Discharge status: Home / Self Care | Payer: Medicaid Other | Source: Ambulatory Visit

## 2015-09-15 DIAGNOSIS — Z1231 Encounter for screening mammogram for malignant neoplasm of breast: Secondary | ICD-10-CM

## 2016-01-18 ENCOUNTER — Emergency Department (HOSPITAL_COMMUNITY)
Admission: EM | Admit: 2016-01-18 | Discharge: 2016-01-18 | Disposition: A | Discharge status: Home / Self Care | Payer: Medicaid Other | Attending: Emergency Medicine | Admitting: Emergency Medicine

## 2016-01-18 ENCOUNTER — Emergency Department (HOSPITAL_COMMUNITY): Payer: Medicaid Other

## 2016-01-18 ENCOUNTER — Encounter (HOSPITAL_COMMUNITY): Payer: Self-pay | Admitting: *Deleted

## 2016-01-18 DIAGNOSIS — Y9289 Other specified places as the place of occurrence of the external cause: Secondary | ICD-10-CM | POA: Diagnosis not present

## 2016-01-18 DIAGNOSIS — Z791 Long term (current) use of non-steroidal anti-inflammatories (NSAID): Secondary | ICD-10-CM | POA: Diagnosis not present

## 2016-01-18 DIAGNOSIS — F17210 Nicotine dependence, cigarettes, uncomplicated: Secondary | ICD-10-CM | POA: Insufficient documentation

## 2016-01-18 DIAGNOSIS — I10 Essential (primary) hypertension: Secondary | ICD-10-CM | POA: Insufficient documentation

## 2016-01-18 DIAGNOSIS — Z79899 Other long term (current) drug therapy: Secondary | ICD-10-CM | POA: Diagnosis not present

## 2016-01-18 DIAGNOSIS — W2209XA Striking against other stationary object, initial encounter: Secondary | ICD-10-CM | POA: Diagnosis not present

## 2016-01-18 DIAGNOSIS — Y9389 Activity, other specified: Secondary | ICD-10-CM | POA: Diagnosis not present

## 2016-01-18 DIAGNOSIS — S92912A Unspecified fracture of left toe(s), initial encounter for closed fracture: Secondary | ICD-10-CM

## 2016-01-18 DIAGNOSIS — Z7952 Long term (current) use of systemic steroids: Secondary | ICD-10-CM | POA: Insufficient documentation

## 2016-01-18 DIAGNOSIS — Z792 Long term (current) use of antibiotics: Secondary | ICD-10-CM | POA: Diagnosis not present

## 2016-01-18 DIAGNOSIS — G8929 Other chronic pain: Secondary | ICD-10-CM | POA: Insufficient documentation

## 2016-01-18 DIAGNOSIS — S92512A Displaced fracture of proximal phalanx of left lesser toe(s), initial encounter for closed fracture: Secondary | ICD-10-CM | POA: Insufficient documentation

## 2016-01-18 DIAGNOSIS — S99922A Unspecified injury of left foot, initial encounter: Secondary | ICD-10-CM | POA: Diagnosis present

## 2016-01-18 DIAGNOSIS — Y999 Unspecified external cause status: Secondary | ICD-10-CM | POA: Diagnosis not present

## 2016-01-18 DIAGNOSIS — F419 Anxiety disorder, unspecified: Secondary | ICD-10-CM | POA: Insufficient documentation

## 2016-01-18 DIAGNOSIS — E7800 Pure hypercholesterolemia, unspecified: Secondary | ICD-10-CM | POA: Insufficient documentation

## 2016-01-18 MED ORDER — KETOROLAC TROMETHAMINE 30 MG/ML IJ SOLN
30.0000 mg | Freq: Once | INTRAMUSCULAR | Status: AC
  Administered 2016-01-18: 30 mg via INTRAMUSCULAR
  Filled 2016-01-18: qty 1

## 2016-01-18 MED ORDER — IBUPROFEN 600 MG PO TABS: 600.0000 mg | ORAL_TABLET | Freq: Four times a day (QID) | ORAL | Status: AC | PRN

## 2016-01-18 NOTE — ED Notes (Signed)
Pt hit LT 4ht toe on a table leg.Pt reports pain to be 10/10

## 2016-01-18 NOTE — ED Notes (Signed)
PT in hall stated" I am ready to go home and does not want to wait for x ray."

## 2016-01-18 NOTE — ED Notes (Signed)
Declined W/C at D/C and was escorted to lobby by RN. 

## 2016-01-18 NOTE — Discharge Instructions (Signed)
Toe Fracture Follow up with orthopedics for toe fracture. A toe fracture is a break in one of the toe bones (phalanges). HOME CARE If You Have a Cast:  Do not stick anything inside the cast to scratch your skin.  Check the skin around the cast every day. Tell your doctor about any concerns. Do not put lotion on the skin underneath the cast. You may put lotion on dry skin around the edges of the cast.  Do not put pressure on any part of the cast until it is fully hardened. This may take many hours.  Keep the cast clean and dry. Bathing  Do not take baths, swim, or use a hot tub until your doctor says that you can. Ask your doctor if you can take showers. You may only be allowed to take sponge baths for bathing.  If your doctor says that bathing and showering are okay, cover the cast or bandage (dressing) with a watertight plastic bag to protect it from water. Do not let the cast or bandage get wet. Managing Pain, Stiffness, and Swelling  If you do not have a cast, put ice on the injured area if told by your doctor:  Put ice in a plastic bag.  Place a towel between your skin and the bag.  Leave the ice on for 20 minutes, 2-3 times per day.  Move your toes often to avoid stiffness and to lessen swelling.  Raise (elevate) the injured area above the level of your heart while you are sitting or lying down. Driving  Do not drive or use heavy machinery while taking pain medicine.  Do not drive while wearing a cast on a foot that you use for driving. Activity  Return to your normal activities as told by your doctor. Ask your doctor what activities are safe for you.  Perform exercises daily as told by your doctor or therapist. Safety  Do not use your leg to support your body weight until your doctor says that you can. Use crutches or other tools to help you move around as told by your doctor. General Instructions  If your toe was taped to a toe that is next to it (buddy taping),  follow your doctor's instructions for changing the gauze and tape. Change it more often:  If the gauze and tape get wet. If this happens, dry the space between the toes.  If the gauze and tape are too tight and they cause your toe to become pale or to lose feeling (numb).  Wear a protective shoe as told by your doctor. If you were not given one, wear sturdy shoes that support your foot. Your shoes should not pinch your toes. Your shoes should not fit tightly against your toes.  Do not use any tobacco products, including cigarettes, chewing tobacco, or e-cigarettes. Tobacco can delay bone healing. If you need help quitting, ask your doctor.  Take medicines only as told by your doctor.  Keep all follow-up visits as told by your doctor. This is important. GET HELP IF:  You have a fever.  Your pain medicine is not helping.  Your toe feels cold.  You lose feeling (have numbness) in your toe.  You still have pain after one week of rest and treatment.  You still have pain after your doctor has said that you can start walking again.  You have pain or tingling in your foot, and it is not going away.  You have loss of feeling in your foot,  and it is not going away. GET HELP RIGHT AWAY IF:  You have severe pain.  You have redness or swelling (inflammation) in your toe, and it is getting worse.  You have pain or loss of feeling in your toe, and it is getting worse.  Your toe is blue.   This information is not intended to replace advice given to you by your health care provider. Make sure you discuss any questions you have with your health care provider.   Document Released: 03/21/2008 Document Revised: 02/17/2015 Document Reviewed: 07/30/2014 Elsevier Interactive Patient Education [employer]2016 [employer] Inc[employer]

## 2016-01-18 NOTE — ED Provider Notes (Signed)
CSN: [id number]     Arrival date & time 01/18/16  1221 History  By signing my name below, I, Tanda Rockers, attest that this documentation has been prepared under the direction and in the presence of [employer], PA-C. Electronically Signed: Tanda Rockers, ED Scribe. 01/18/2016. 1:36 PM.   Chief Complaint  Patient presents with  . Toe Pain   The history is provided by the patient. No language interpreter was used.     HPI Comments: Misty Manning is a 55 y.o. female who presents to the Emergency Department complaining of sudden onset, constant, pain and swelling to left 4th toe that began last night. Pt states that she stumped her toe on the leg of her table last night causing immediate pain to the toe. Pt has not taken any pain medication PTA. Denies weakness, numbness, tingling, or any other associated symptoms.   Past Medical History  Diagnosis Date  . Chronic pain   . Anxiety   . Hypertension   . High cholesterol    Past Surgical History  Procedure Laterality Date  . No past surgeries     Family History  Problem Relation Age of Onset  . Hypertension Father   . Alcoholism Father    Social History  Substance Use Topics  . Smoking status: Current Every Day Smoker -- 0.33 packs/day    Types: Cigarettes  . Smokeless tobacco: None  . Alcohol Use: 2.4 oz/week    4 Cans of beer per week     Comment: 4 40 oz cans /week   OB History    No data available     Review of Systems  Constitutional: Negative for fever.  Musculoskeletal: Positive for joint swelling and arthralgias (left 4th toe).  Skin: Negative for wound.  Neurological: Negative for weakness and numbness.   Allergies  Review of patient's allergies indicates no known allergies.  Home Medications   Prior to Admission medications   Medication Sig Start Date End Date Taking? Authorizing Provider  ALPRAZolam Prudy Feeler) 1 MG tablet Take 1 mg by mouth daily as needed.     Historical Provider, MD  cephALEXin  (KEFLEX) 500 MG capsule Take 1 capsule (500 mg total) by mouth 4 (four) times daily. 04/06/15   Danelle Berry, PA-C  cyclobenzaprine (FLEXERIL) 10 MG tablet Take 10 mg by mouth 3 (three) times daily.    Historical Provider, MD  diclofenac (VOLTAREN) 75 MG EC tablet Take 75 mg by mouth 2 (two) times daily.    Historical Provider, MD  famotidine (PEPCID) 20 MG tablet Take 1 tablet (20 mg total) by mouth 2 (two) times daily. 01/24/15   Ria Clock, PA  HYDROcodone-acetaminophen (NORCO/VICODIN) 5-325 MG per tablet Take 1-2 tablets by mouth every 4 (four) hours as needed for severe pain. 04/06/15   Danelle Berry, PA-C  ibuprofen (ADVIL,MOTRIN) 600 MG tablet Take 1 tablet (600 mg total) by mouth every 6 (six) hours as needed. 01/18/16   Kennisha Qin Patel-Mills, PA-C  lisinopril (PRINIVIL,ZESTRIL) 10 MG tablet Take 10 mg by mouth daily.    Historical Provider, MD  methylPREDNIsolone (MEDROL DOSPACK) 4 MG tablet follow package directions Do not take in combination with Voltaren 01/24/15   Mathis Fare Presson, PA  metroNIDAZOLE (FLAGYL) 500 MG tablet Take 1 tablet (500 mg total) by mouth 2 (two) times daily. 04/03/14   Jillyn Ledger, PA-C  simvastatin (ZOCOR) 20 MG tablet Take 20 mg by mouth daily.    Historical Provider, MD  triamcinolone cream (KENALOG)  0.5 % Apply 1 application topically 2 (two) times daily.    Historical Provider, MD   BP 117/85 mmHg  Pulse 83  Temp(Src) 97.7 F (36.5 C) (Oral)  Resp 20  Ht 5\' 5"  (1.651 m)  Wt 61.236 kg  BMI 22.47 kg/m2  SpO2 100%   Physical Exam  Constitutional: She is oriented to person, place, and time. She appears well-developed and well-nourished. No distress.  HENT:  Head: Normocephalic and atraumatic.  Eyes: Conjunctivae and EOM are normal.  Neck: Neck supple. No tracheal deviation present.  Cardiovascular: Normal rate.   Pulmonary/Chest: Effort normal. No respiratory distress.  Musculoskeletal: Normal range of motion. She exhibits tenderness.  4th  left toe is mildly swollen with tenderness to palpation and tenderness with flexion of the toe.  No ecchymosis. 2+ DP pulse.   Neurological: She is alert and oriented to person, place, and time.  Skin: Skin is warm and dry.  Psychiatric: She has a normal mood and affect. Her behavior is normal.  Nursing note and vitals reviewed.   ED Course  Procedures (including critical care time)  DIAGNOSTIC STUDIES: Oxygen Saturation is 100% on RA, normal by my interpretation.    COORDINATION OF CARE: 1:35 PM-Discussed treatment plan which includes DG Left 4th Toe with pt at bedside and pt agreed to plan.   Labs Review Labs Reviewed - No data to display  Imaging Review Dg Toe 4th Left  01/18/2016  CLINICAL DATA:  Hit toe on sofa EXAM: LEFT FOURTH TOE--3 V COMPARISON:  None. FINDINGS: Frontal, oblique, and lateral views were obtained. There is an obliquely oriented fracture of the fourth proximal phalanx with slight lateral angulation distally. No other fractures. No dislocations. Joint spaces appear intact IMPRESSION: Fracture fourth proximal phalanx with overall alignment near anatomic. No other fracture. No dislocation. No apparent arthropathy. Electronically Signed   By: Bretta BangWilliam  Woodruff III M.D.   On: 01/18/2016 14:20   I have personally reviewed and evaluated these images as part of my medical decision-making.   EKG Interpretation None      MDM   Final diagnoses:  Toe fracture, left, closed, initial encounter   Patient X-Ray positive for fracture of fourth proximal phalanx with overall alignment near anatomic. Pt advised to follow up with orthopedics. Toes were buddy taped. Patient given post op shoe while in ED, conservative therapy recommended and discussed. Patient will be discharged home & is agreeable with above plan. Returns precautions discussed. Pt appears safe for discharge.  I personally performed the services described in this documentation, which was scribed in my presence.  The recorded information has been reviewed and is accurate.      Catha GosselinHanna Patel-Mills, PA-C 01/18/16 1509  Catha GosselinHanna Patel-Mills, PA-C 01/18/16 1510  Laurence Spatesachel Morgan Little, MD 01/18/16 1530

## 2016-06-20 ENCOUNTER — Encounter (HOSPITAL_COMMUNITY): Payer: Self-pay | Admitting: Vascular Surgery

## 2016-06-20 ENCOUNTER — Emergency Department (HOSPITAL_COMMUNITY): Payer: Medicaid Other

## 2016-06-20 ENCOUNTER — Emergency Department (HOSPITAL_COMMUNITY)
Admission: EM | Admit: 2016-06-20 | Discharge: 2016-06-21 | Disposition: A | Discharge status: Home / Self Care | Payer: Medicaid Other | Attending: Emergency Medicine | Admitting: Emergency Medicine

## 2016-06-20 DIAGNOSIS — Y9302 Activity, running: Secondary | ICD-10-CM | POA: Diagnosis not present

## 2016-06-20 DIAGNOSIS — W2209XA Striking against other stationary object, initial encounter: Secondary | ICD-10-CM | POA: Diagnosis not present

## 2016-06-20 DIAGNOSIS — R52 Pain, unspecified: Secondary | ICD-10-CM

## 2016-06-20 DIAGNOSIS — W19XXXA Unspecified fall, initial encounter: Secondary | ICD-10-CM

## 2016-06-20 DIAGNOSIS — Y929 Unspecified place or not applicable: Secondary | ICD-10-CM | POA: Diagnosis not present

## 2016-06-20 DIAGNOSIS — Y999 Unspecified external cause status: Secondary | ICD-10-CM | POA: Diagnosis not present

## 2016-06-20 DIAGNOSIS — Z79899 Other long term (current) drug therapy: Secondary | ICD-10-CM | POA: Insufficient documentation

## 2016-06-20 DIAGNOSIS — F17210 Nicotine dependence, cigarettes, uncomplicated: Secondary | ICD-10-CM | POA: Diagnosis not present

## 2016-06-20 DIAGNOSIS — I10 Essential (primary) hypertension: Secondary | ICD-10-CM | POA: Insufficient documentation

## 2016-06-20 DIAGNOSIS — R000 Tachycardia, unspecified: Secondary | ICD-10-CM | POA: Insufficient documentation

## 2016-06-20 DIAGNOSIS — S20212A Contusion of left front wall of thorax, initial encounter: Secondary | ICD-10-CM | POA: Diagnosis not present

## 2016-06-20 DIAGNOSIS — R0781 Pleurodynia: Secondary | ICD-10-CM

## 2016-06-20 DIAGNOSIS — S299XXA Unspecified injury of thorax, initial encounter: Secondary | ICD-10-CM | POA: Diagnosis present

## 2016-06-20 NOTE — ED Provider Notes (Signed)
[facility]-EMERGENCY DEPT Provider Note   CSN: [id number] Arrival date & time: 06/20/16  2242   By signing my name below, I, Christel Mormon, attest that this documentation has been prepared under the direction and in the presence of [facility] M. Damian Leavell, NP. Electronically Signed: Christel Mormon, Scribe. 06/20/2016. 11:40 PM.   History   Chief Complaint Chief Complaint  Patient presents with  . Rib Injury    The history is provided by the patient. No language interpreter was used.   HPI Comments:  Misty Manning is a 55 y.o. female who presents to the Emergency Department complaining of sudden onset, constant L ribcage pain sustained after an injury last night. Pt describes the pain as 10/10. Pt reports that she was playing around with her daughters and then she ran into a sharp edge of table and has been having pain since the injury. Pt also notes pain on her R ribcage. Pt notes no alleviating factors. Pt denies bladder/incontinence, nausea, vomiting.   Past Medical History:  Diagnosis Date  . Anxiety   . Chronic pain   . High cholesterol   . Hypertension     There are no active problems to display for this patient.   Past Surgical History:  Procedure Laterality Date  . NO PAST SURGERIES      OB History    No data available       Home Medications    Prior to Admission medications   Medication Sig Start Date End Date Taking? Authorizing Provider  ALPRAZolam Prudy Feeler) 1 MG tablet Take 1 mg by mouth daily as needed.     Historical Provider, MD  cephALEXin (KEFLEX) 500 MG capsule Take 1 capsule (500 mg total) by mouth 4 (four) times daily. 04/06/15   Danelle Berry, PA-C  cyclobenzaprine (FLEXERIL) 10 MG tablet Take 10 mg by mouth 3 (three) times daily.    Historical Provider, MD  diclofenac (VOLTAREN) 75 MG EC tablet Take 75 mg by mouth 2 (two) times daily.    Historical Provider, MD  famotidine (PEPCID) 20 MG tablet Take 1 tablet (20 mg total) by mouth 2 (two) times daily. 01/24/15    Ria Clock, PA  HYDROcodone-acetaminophen (NORCO/VICODIN) 5-325 MG per tablet Take 1-2 tablets by mouth every 4 (four) hours as needed for severe pain. 04/06/15   Danelle Berry, PA-C  ibuprofen (ADVIL,MOTRIN) 600 MG tablet Take 1 tablet (600 mg total) by mouth every 6 (six) hours as needed. 01/18/16   Hanna Patel-Mills, PA-C  lisinopril (PRINIVIL,ZESTRIL) 10 MG tablet Take 10 mg by mouth daily.    Historical Provider, MD  methylPREDNIsolone (MEDROL DOSPACK) 4 MG tablet follow package directions Do not take in combination with Voltaren 01/24/15   Mathis Fare Presson, PA  metroNIDAZOLE (FLAGYL) 500 MG tablet Take 1 tablet (500 mg total) by mouth 2 (two) times daily. 04/03/14   Jillyn Ledger, PA-C  simvastatin (ZOCOR) 20 MG tablet Take 20 mg by mouth daily.    Historical Provider, MD  traMADol (ULTRAM) 50 MG tablet Take 1 tablet (50 mg total) by mouth every 6 (six) hours as needed. 06/21/16   Hope Orlene Och, NP  triamcinolone cream (KENALOG) 0.5 % Apply 1 application topically 2 (two) times daily.    Historical Provider, MD    Family History Family History  Problem Relation Age of Onset  . Hypertension Father   . Alcoholism Father     Social History Social History  Substance Use Topics  . Smoking status: Current  Every Day Smoker    Packs/day: 1.50    Types: Cigarettes  . Smokeless tobacco: Never Used  . Alcohol use 2.4 oz/week    4 Cans of beer per week     Comment: 4 40 oz cans /week, quart beer in 2 days     Allergies   Review of patient's allergies indicates no known allergies.   Review of Systems Review of Systems  Eyes: Negative for visual disturbance.  Respiratory: Positive for shortness of breath.   Gastrointestinal: Negative for abdominal pain, nausea and vomiting.  Genitourinary: Negative for dysuria.  Musculoskeletal: Positive for myalgias.  Skin:       Abrasion to left side  Psychiatric/Behavioral: The patient is not nervous/anxious.      Physical  Exam Updated Vital Signs BP 101/72 (BP Location: Right Arm)   Pulse 102   Temp 98.3 F (36.8 C) (Oral)   Resp 16   SpO2 98%   Physical Exam  Constitutional: She is oriented to person, place, and time. She appears well-developed and well-nourished. No distress.  HENT:  Head: Normocephalic and atraumatic.  Eyes: Conjunctivae and EOM are normal.  Neck: Normal range of motion. Neck supple.  Cardiovascular: Regular rhythm.  Tachycardia present.   Pulmonary/Chest: Effort normal and breath sounds normal.    Tender left anterior ribs with palpation. Ecchymosis and abrasion noted.  Mildly tender right ribs.   Abdominal: Soft. She exhibits no distension. There is no tenderness.  Musculoskeletal: She exhibits no edema.  Radial pulses 2+, adequate circulation, equal grips.   Neurological: She is alert and oriented to person, place, and time. She has normal strength. No sensory deficit. Gait normal.  Skin: Skin is warm and dry.  Abrasion left side  Psychiatric: She has a normal mood and affect. Her behavior is normal.  Nursing note and vitals reviewed.    ED Treatments / Results  DIAGNOSTIC STUDIES:  Oxygen Saturation is 98% on RA, normal by my interpretation.    COORDINATION OF CARE:  11:40 PM Discussed treatment plan with pt at bedside and pt agreed to plan.  Labs (all labs ordered are listed, but only abnormal results are displayed) Labs Reviewed - No data to display   Radiology Dg Chest 2 View  Result Date: 06/20/2016 CLINICAL DATA:  Pain. EXAM: CHEST  2 VIEW COMPARISON:  February 12, 2009 FINDINGS: The heart size and mediastinal contours are within normal limits. Both lungs are clear. The visualized skeletal structures are unremarkable. IMPRESSION: No active cardiopulmonary disease. Electronically Signed   By: Gerome Samavid  Williams III M.D   On: 06/20/2016 23:28   Dg Ribs Unilateral Left  Result Date: 06/21/2016 CLINICAL DATA:  55 year old female with trauma to the left lower chest.  EXAM: LEFT RIBS - 2 VIEW COMPARISON:  Chest radiograph dated 06/20/2016 FINDINGS: No acute rib fracture identified. The visualized portion of the lungs is clear. The cardiac silhouette is within normal limits. The soft tissues appear unremarkable. IMPRESSION: No acute rib fracture or pneumothorax. Electronically Signed   By: Elgie CollardArash  Radparvar M.D.   On: 06/21/2016 00:31    Procedures Procedures (including critical care time)  Medications Ordered in ED Medications  oxyCODONE-acetaminophen (PERCOCET/ROXICET) 5-325 MG per tablet 1 tablet (1 tablet Oral Given 06/21/16 0021)     Initial Impression / Assessment and Plan / ED Course  I have reviewed the triage vital signs and the nursing notes.  Pertinent imaging results that were available during my care of the patient were reviewed by me and considered  in my medical decision making (see chart for details).  Clinical Course     Final Clinical Impressions(s) / ED Diagnoses  56 y.o. female with left rib pain s/p fall >24 hours prior to ED visit stable for d/c without respiratory difficulty and O2 SAT 98% on R/A, no rib fracture or pneumothorax on x-ray. Discussed with the patient clinical and x-ray findings and plan of care and all questioned fully answered. She will f/u with her PCP or return here if any problems arise.  Final diagnoses:  Fall, initial encounter  Rib pain    New Prescriptions New Prescriptions   TRAMADOL (ULTRAM) 50 MG TABLET    Take 1 tablet (50 mg total) by mouth every 6 (six) hours as needed.  I personally performed the services described in this documentation, which was scribed in my presence. The recorded information has been reviewed and is accurate.     [address] [employer], [place] 06/21/16 1610    Tomasita Crumble, MD 06/21/16 [phone]

## 2016-06-20 NOTE — ED Triage Notes (Signed)
Pt reports to the ED for eval of left ribcage pain. States she ran into the sharp edge of a table and is now having pain. Reports minimal amount of SOB.

## 2016-06-21 ENCOUNTER — Emergency Department (HOSPITAL_COMMUNITY): Payer: Medicaid Other

## 2016-06-21 MED ORDER — OXYCODONE-ACETAMINOPHEN 5-325 MG PO TABS
1.0000 | ORAL_TABLET | Freq: Once | ORAL | Status: AC
  Administered 2016-06-21: 1 via ORAL
  Filled 2016-06-21: qty 1

## 2016-06-21 MED ORDER — TRAMADOL HCL 50 MG PO TABS: 50.0000 mg | ORAL_TABLET | Freq: Four times a day (QID) | ORAL | 0 refills | Status: AC | PRN

## 2016-06-21 NOTE — Discharge Instructions (Signed)
Do not drive while taking the narcotic pain medication because it will make you sleepy. Follow up with your doctor or return here as needed.

## 2017-01-04 ENCOUNTER — Other Ambulatory Visit: Payer: Self-pay | Admitting: Internal Medicine

## 2017-01-04 DIAGNOSIS — E2839 Other primary ovarian failure: Secondary | ICD-10-CM

## 2017-01-13 ENCOUNTER — Other Ambulatory Visit: Payer: Medicaid Other

## 2017-03-15 ENCOUNTER — Emergency Department (HOSPITAL_COMMUNITY)
Admission: EM | Admit: 2017-03-15 | Discharge: 2017-03-15 | Discharge status: Against Medical Advice | Payer: Medicaid Other

## 2017-03-15 NOTE — ED Notes (Signed)
Called x 3 no answer in waiting room

## 2017-03-15 NOTE — ED Notes (Signed)
Pt. Called x 3 times , no answer.

## 2017-06-24 ENCOUNTER — Encounter (HOSPITAL_COMMUNITY): Payer: Self-pay | Admitting: Emergency Medicine

## 2017-06-24 ENCOUNTER — Emergency Department (HOSPITAL_COMMUNITY)
Admission: EM | Admit: 2017-06-24 | Discharge: 2017-06-25 | Disposition: A | Discharge status: Home / Self Care | Payer: Medicaid Other | Attending: Emergency Medicine | Admitting: Emergency Medicine

## 2017-06-24 DIAGNOSIS — F419 Anxiety disorder, unspecified: Secondary | ICD-10-CM | POA: Diagnosis not present

## 2017-06-24 DIAGNOSIS — Z5321 Procedure and treatment not carried out due to patient leaving prior to being seen by health care provider: Secondary | ICD-10-CM | POA: Insufficient documentation

## 2017-06-24 NOTE — ED Notes (Signed)
Called pt for room, no response. 

## 2017-06-24 NOTE — ED Triage Notes (Addendum)
EMS reports pt got into an argument with her daughter this am and called EMS. Pt calm in triage. Pt reports she has a cough and then began to develop a sore throat. Pt also complains of hemorrhoids.

## 2017-06-24 NOTE — ED Notes (Signed)
Pt called for room 2nd time, no response.

## 2017-10-18 ENCOUNTER — Emergency Department (HOSPITAL_COMMUNITY)
Admission: EM | Admit: 2017-10-18 | Discharge: 2017-10-18 | Disposition: A | Discharge status: Home / Self Care | Payer: Medicaid Other | Attending: Emergency Medicine | Admitting: Emergency Medicine

## 2017-10-18 ENCOUNTER — Other Ambulatory Visit: Payer: Self-pay

## 2017-10-18 ENCOUNTER — Emergency Department (HOSPITAL_COMMUNITY): Payer: Medicaid Other

## 2017-10-18 DIAGNOSIS — M791 Myalgia, unspecified site: Secondary | ICD-10-CM

## 2017-10-18 DIAGNOSIS — Y929 Unspecified place or not applicable: Secondary | ICD-10-CM | POA: Diagnosis not present

## 2017-10-18 DIAGNOSIS — M25551 Pain in right hip: Secondary | ICD-10-CM | POA: Diagnosis not present

## 2017-10-18 DIAGNOSIS — Y999 Unspecified external cause status: Secondary | ICD-10-CM | POA: Insufficient documentation

## 2017-10-18 DIAGNOSIS — Y9301 Activity, walking, marching and hiking: Secondary | ICD-10-CM | POA: Diagnosis not present

## 2017-10-18 DIAGNOSIS — I10 Essential (primary) hypertension: Secondary | ICD-10-CM | POA: Diagnosis not present

## 2017-10-18 DIAGNOSIS — Z79899 Other long term (current) drug therapy: Secondary | ICD-10-CM | POA: Insufficient documentation

## 2017-10-18 DIAGNOSIS — R0789 Other chest pain: Secondary | ICD-10-CM | POA: Insufficient documentation

## 2017-10-18 DIAGNOSIS — W0110XA Fall on same level from slipping, tripping and stumbling with subsequent striking against unspecified object, initial encounter: Secondary | ICD-10-CM | POA: Insufficient documentation

## 2017-10-18 DIAGNOSIS — F17210 Nicotine dependence, cigarettes, uncomplicated: Secondary | ICD-10-CM | POA: Diagnosis not present

## 2017-10-18 DIAGNOSIS — W19XXXA Unspecified fall, initial encounter: Secondary | ICD-10-CM

## 2017-10-18 DIAGNOSIS — M542 Cervicalgia: Secondary | ICD-10-CM | POA: Insufficient documentation

## 2017-10-18 MED ORDER — ETODOLAC 400 MG PO TABS: 400.0000 mg | ORAL_TABLET | Freq: Two times a day (BID) | ORAL | 0 refills | Status: AC | PRN

## 2017-10-18 MED ORDER — KETOROLAC TROMETHAMINE 60 MG/2ML IM SOLN
15.0000 mg | Freq: Once | INTRAMUSCULAR | Status: AC
  Administered 2017-10-18: 15 mg via INTRAMUSCULAR
  Filled 2017-10-18: qty 2

## 2017-10-18 NOTE — ED Triage Notes (Signed)
Patient returned form a break and slipped on newly waxed floor. Patient states her "entire right side from her head to her toes" hurts at this time.

## 2017-10-18 NOTE — ED Notes (Signed)
Patient states "everything hurts" upon examination. Patient asked for pain medication to "stop the pain".

## 2017-10-18 NOTE — ED Notes (Signed)
Patient transported to X-ray 

## 2017-10-18 NOTE — ED Notes (Signed)
Patient states unable to remove clothing due to pain.  Adivsed patient I would need to remove clothing by cutting them off if she was unable to tolerate removal.  Patient then began taking off shirt, shoes, pants and bra without difficulty, moving all extremities without difficulty.  No distress noted while undressing.  A bottle of cyclobenzaprine was found inside bra and placed with belongings in patient belonging bag.

## 2017-10-18 NOTE — ED Notes (Signed)
Patient currently on cell phone and asking this tech for pain medication.

## 2017-10-18 NOTE — Discharge Instructions (Signed)
It was my pleasure taking care of you today!   Tylenol or ibuprofen as needed for pain.  Warm Epson salt baths will help with muscle soreness.  Ice affected areas as needed for additional pain relief.   Follow up with your primary care doctor if symptoms are not improving over the next week.   Return to ER for new or worsening symptoms, any additional concerns.

## 2017-10-18 NOTE — ED Provider Notes (Signed)
[facility] EMERGENCY DEPARTMENT Provider Note   CSN: [id number] Arrival date & time: 10/18/17  1111     History   Chief Complaint Chief Complaint  Patient presents with  . Fall  . Back Pain    HPI Misty Manning is a 57 y.o. female.  The history is provided by the patient and medical records. No language interpreter was used.     Misty Manning is a 57 y.o. female  with a PMH of chronic pain, HTN, HLD, anxiety who presents to the Emergency Department for evaluation after fall.  Patient states that she was taking a smoke break from work when she walked toward the outside door and slipped on the newly waxed floor.  She fell, striking her right rib cage area and hip.  Denies hitting her head, but does endorse neck pain.  No loss of consciousness.  Not on anticoagulation.  No nausea or vomiting.  She states that her "entire right side hurts".  Pain is worse with any type of movement.  Denies any numbness or tingling.   Past Medical History:  Diagnosis Date  . Anxiety   . Chronic pain   . High cholesterol   . Hypertension     There are no active problems to display for this patient.   Past Surgical History:  Procedure Laterality Date  . NO PAST SURGERIES      OB History    No data available       Home Medications    Prior to Admission medications   Medication Sig Start Date End Date Taking? Authorizing Provider  ALPRAZolam Prudy Feeler) 1 MG tablet Take 1 mg by mouth daily as needed.     [provider]  cephALEXin (KEFLEX) 500 MG capsule Take 1 capsule (500 mg total) by mouth 4 (four) times daily. 04/06/15   Danelle Berry, PA-C  cyclobenzaprine (FLEXERIL) 10 MG tablet Take 10 mg by mouth 3 (three) times daily.    [provider]  diclofenac (VOLTAREN) 75 MG EC tablet Take 75 mg by mouth 2 (two) times daily.    [provider]  famotidine (PEPCID) 20 MG tablet Take 1 tablet (20 mg total) by mouth 2 (two) times daily. 01/24/15    Presson, Mathis Fare, PA  HYDROcodone-acetaminophen (NORCO/VICODIN) 5-325 MG per tablet Take 1-2 tablets by mouth every 4 (four) hours as needed for severe pain. 04/06/15   Danelle Berry, PA-C  ibuprofen (ADVIL,MOTRIN) 600 MG tablet Take 1 tablet (600 mg total) by mouth every 6 (six) hours as needed. 01/18/16   Patel-Mills, Lorelle Formosa, PA-C  lisinopril (PRINIVIL,ZESTRIL) 10 MG tablet Take 10 mg by mouth daily.    [provider]  methylPREDNIsolone (MEDROL DOSPACK) 4 MG tablet follow package directions Do not take in combination with Voltaren 01/24/15   Presson, Jess Barters H, PA  metroNIDAZOLE (FLAGYL) 500 MG tablet Take 1 tablet (500 mg total) by mouth 2 (two) times daily. 04/03/14   Rubye Oaks, Janene Harvey, PA-C  simvastatin (ZOCOR) 20 MG tablet Take 20 mg by mouth daily.    [provider]  traMADol (ULTRAM) 50 MG tablet Take 1 tablet (50 mg total) by mouth every 6 (six) hours as needed. 06/21/16   Janne Napoleon, NP  triamcinolone cream (KENALOG) 0.5 % Apply 1 application topically 2 (two) times daily.    [provider]    Family History Family History  Problem Relation Age of Onset  . Hypertension Father   . Alcoholism Father  Social History Social History   Tobacco Use  . Smoking status: Current Every Day Smoker    Packs/day: 1.50    Types: Cigarettes  . Smokeless tobacco: Never Used  Substance Use Topics  . Alcohol use: Yes    Alcohol/week: 2.4 oz    Types: 4 Cans of beer per week    Comment: 4 40 oz cans /week, quart beer in 2 days  . Drug use: No     Allergies   Patient has no known allergies.   Review of Systems Review of Systems  Musculoskeletal: Positive for arthralgias and myalgias.  All other systems reviewed and are negative.    Physical Exam Updated Vital Signs BP 132/83 (BP Location: Left Arm)   Pulse 99   Temp 97.8 F (36.6 C) (Oral)   Resp (!) 22   Ht 5\' 5"  (1.651 m)   Wt 68 kg (150 lb)   SpO2 99%   BMI 24.96 kg/m    Physical Exam  Constitutional: She is oriented to person, place, and time. She appears well-developed and well-nourished. No distress.  HENT:  Head: Normocephalic and atraumatic.  Neck:  Right-sided paraspinal tenderness.  Cardiovascular: Normal rate, regular rhythm and normal heart sounds.  No murmur heard. Pulmonary/Chest: Effort normal and breath sounds normal. No respiratory distress.  Abdominal: Soft. She exhibits no distension. There is no tenderness.  Musculoskeletal:  5/5 muscle strength in upper and lower extremities bilaterally including strong and equal grip strength and dorsiflexion/plantar flexion.  Diffuse tenderness to the right rib cage and right lateral hip with no overlying skin changes.  Of note, patient has no tenderness to palpation when pressing firmly on the same rib cage area during auscultation.  Full range of motion of the hips and LE's. All four extremities NVI.  Neurological: She is alert and oriented to person, place, and time.  Alert, oriented, thought content appropriate, able to give a coherent history. Speech is clear and goal oriented, able to follow commands.  Cranial Nerves:  II:  Peripheral visual fields grossly normal, pupils equal, round, reactive to light III, IV, VI: EOM intact bilaterally, ptosis not present V,VII: smile symmetric, eyes kept closed tightly against resistance, facial light touch sensation equal VIII: hearing grossly normal IX, X: symmetric soft palate movement, uvula elevates symmetrically  XI: bilateral shoulder shrug symmetric and strong XII: midline tongue extension Sensory to light touch normal in all four extremities.  Normal finger-to-nose and rapid alternating movements;  No drift.  Skin: Skin is warm and dry.  No ecchymosis, erythema, open wounds or other signs of trauma.  Nursing note and vitals reviewed.    ED Treatments / Results  Labs (all labs ordered are listed, but only abnormal results are displayed) Labs  Reviewed - No data to display  EKG  EKG Interpretation None       Radiology Dg Ribs Unilateral W/chest Right  Result Date: 10/18/2017 CLINICAL DATA:  Right rib pain secondary to a fall at school today. EXAM: RIGHT RIBS AND CHEST - 3+ VIEW COMPARISON:  Chest x-ray dated 06/20/2016 FINDINGS: No fracture or other bone lesions are seen involving the ribs. There is no evidence of pneumothorax or pleural effusion. Both lungs are clear. Heart size and mediastinal contours are within normal limits. IMPRESSION: Negative. Electronically Signed   By: Francene BoyersJames  Maxwell M.D.   On: 10/18/2017 13:40   Dg Cervical Spine Complete  Result Date: 10/18/2017 CLINICAL DATA:  Neck pain after fall at school today. EXAM: CERVICAL SPINE -  COMPLETE 4+ VIEW COMPARISON:  None. FINDINGS: There is no evidence of cervical spine fracture or prevertebral soft tissue swelling. Alignment is normal. Focal hypertrophy and slight arthritis of the left facet joint at C5-6. No foraminal encroachment. IMPRESSION: No acute abnormalities.  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 Electronically Signed   By: Francene Boyers M.D.   On: 10/18/2017 13:38   Dg Hip Unilat W Or Wo Pelvis 2-3 Views Right  Result Date: 10/18/2017 CLINICAL DATA:  Right hip pain after fall at school today. EXAM: DG HIP (WITH OR WITHOUT PELVIS) 2-3V RIGHT COMPARISON:  None. FINDINGS: There is no fracture or dislocation. Small marginal osteophyte on the inferior aspect of the femoral head. No joint space narrowing. Soft tissues are normal. IMPRESSION: No acute abnormality.  Slight arthritic changes. Electronically Signed   By: Francene Boyers M.D.   On: 10/18/2017 13:39    Procedures Procedures (including critical care time)  Medications Ordered in ED Medications  ketorolac (TORADOL) injection 15 mg (15 mg Intramuscular Given 10/18/17 1247)     Initial Impression / Assessment and Plan / ED Course  I have reviewed the triage vital signs and the nursing  notes.  Pertinent labs & imaging results that were available during my care of the patient were reviewed by me and considered in my medical decision making (see chart for details).    Misty Manning is a 57 y.o. female who presents to ED for evaluation after mechanical fall.  No head injury. No focal neuro deficits on exam. Right sided cervical paraspinal musculature tenderness and tenderness to the right ribcage and hip, although these findings do seem to be distractible. X-rays with no acute findings. Symptomatic home care instructions discussed. PCP follow up if no improvement. Return precautions discussed and all questions answered.   Final Clinical Impressions(s) / ED Diagnoses   Final diagnoses:  Fall, initial encounter  Muscle soreness    ED Discharge Orders    None       Michael Walrath, Chase Picket, PA-C 10/18/17 1356    Linwood Dibbles, MD 10/18/17 [phone]

## 2017-11-15 ENCOUNTER — Other Ambulatory Visit: Payer: Self-pay | Admitting: Internal Medicine

## 2017-11-15 DIAGNOSIS — Z1231 Encounter for screening mammogram for malignant neoplasm of breast: Secondary | ICD-10-CM

## 2017-11-21 ENCOUNTER — Other Ambulatory Visit: Payer: Self-pay

## 2017-11-21 ENCOUNTER — Encounter: Payer: Self-pay | Admitting: Physical Therapy

## 2017-11-21 ENCOUNTER — Ambulatory Visit: Payer: Medicaid Other | Attending: Internal Medicine | Admitting: Physical Therapy

## 2017-11-21 DIAGNOSIS — M79604 Pain in right leg: Secondary | ICD-10-CM | POA: Diagnosis present

## 2017-11-21 DIAGNOSIS — M545 Low back pain, unspecified: Secondary | ICD-10-CM

## 2017-11-21 DIAGNOSIS — M542 Cervicalgia: Secondary | ICD-10-CM | POA: Diagnosis not present

## 2017-11-21 DIAGNOSIS — M25511 Pain in right shoulder: Secondary | ICD-10-CM | POA: Diagnosis present

## 2017-11-21 NOTE — Therapy (Signed)
[facility] [address] [place], [place], [address] Phone: [phone]   Fax:  [phone]  Physical Therapy Evaluation  Patient Details  Name: Misty SiasLoretta Manning MRN: [id number] Date of Birth: June 26, 1961 Referring Provider: Fleet ContrasAvbuere, Edwin, MD   Encounter Date: 11/21/2017  PT End of Session - 11/21/17 0959    Visit Number  1    Number of Visits  13    Date for PT Re-Evaluation  01/05/18    Authorization Type  self pay    PT Start Time  1000    PT Stop Time  1039    PT Time Calculation (min)  39 min    Activity Tolerance  Patient limited by pain    Behavior During Therapy  Flat affect       Past Medical History:  Diagnosis Date  . Anxiety   . Chronic pain   . High cholesterol   . Hypertension     Past Surgical History:  Procedure Laterality Date  . NO PAST SURGERIES      There were no vitals filed for this visit.   Subjective Assessment - 11/21/17 1006    Subjective  Fell on floor after getting foot caught in floor mat 2-3 weeks ago. Denies pain before the fall. Feels like an inside pain, like a constant ache. Feels pain along Rt hip and lateral thigh, Rt elbow and has had HA since hitting head. HA localized to Rt temporal region. Has been using hot cloths on neck which is helping.     How long can you sit comfortably?  30-40 min    How long can you walk comfortably?  from here to across the street    Patient Stated Goals  decrease pain, exercise- has a weight machine "workout thing" in my room    Currently in Pain?  Yes    Pain Score  6  shown wong baker faces scale    Pain Location  Back    Pain Orientation  Right;Lower    Pain Descriptors / Indicators  Aching    Aggravating Factors   sitting upright without back support    Pain Relieving Factors  pillow, pain pills mildly helpful    Multiple Pain Sites  Yes    Pain Score  6    Pain Location  Neck    Pain Orientation  Right    Pain Descriptors / Indicators   Aching    Pain Radiating Towards  shoulder    Aggravating Factors   cervical ROM    Pain Relieving Factors  hot cloth         [facility] PT Assessment - 11/21/17 0001      Assessment   Medical Diagnosis  LBP, Rt arm pain, cervicalgia    Referring Provider  Fleet ContrasAvbuere, Edwin, MD    Onset Date/Surgical Date  -- 2-3 weeks ago    Hand Dominance  Left    Next MD Visit  2/28    Prior Therapy  no      Precautions   Precautions  None      Restrictions   Weight Bearing Restrictions  No      Balance Screen   Has the patient fallen in the past 6 months  Yes    How many times?  1    Has the patient had a decrease in activity level because of a fear of falling?   Yes    Is the patient reluctant to leave their home because  of a fear of falling?   No      Home Public house manager residence    Additional Comments  no stairs at home      Prior Function   Level of Independence  Independent    Vocation  Student      Cognition   Overall Cognitive Status  Within Functional Limits for tasks assessed      Sensation   Additional Comments  WFL, denies N/T in UE or LE      Posture/Postural Control   Posture Comments  increased lumbar lordosis      ROM / Strength   AROM / PROM / Strength  AROM;Strength      AROM   AROM Assessment Site  Shoulder;Cervical;Lumbar    Right/Left Shoulder  -- uncomfortable but able to demo full ROM    Cervical Flexion  60    Cervical - Right Side Bend  24    Cervical - Left Side Bend  40    Cervical - Right Rotation  55    Cervical - Left Rotation  62    Lumbar Flexion  90+    Lumbar - Right Side Bend  -- limited vs Lt with pain      Strength   Overall Strength Comments  gross shoulder strength 5/5 with pain    Strength Assessment Site  Shoulder;Hip    Right/Left Shoulder  --    Right/Left Hip  Right;Left    Right Hip Flexion  4-/5    Right Hip Extension  3+/5    Right Hip ABduction  4-/5    Left Hip Flexion  3+/5    Left Hip  Extension  3/5    Left Hip ABduction  4/5      Palpation   Palpation comment  Lt pelvic post innominate rotation             Objective measurements completed on examination: See above findings.      [facility] Adult PT Treatment/Exercise - 11/21/17 0001      Exercises   Exercises  Knee/Hip;Neck      Neck Exercises: Seated   Other Seated Exercise  scapular retraction & postural alignment      Knee/Hip Exercises: Stretches   Other Knee/Hip Stretches  LTR      Knee/Hip Exercises: Supine   Hip Adduction Isometric Limitations  with posterior pelvic tilt    Other Supine Knee/Hip Exercises  isometric Lt hip flexion      Neck Exercises: Stretches   Upper Trapezius Stretch  Right;Left;20 seconds    Levator Stretch  Right;Left;20 seconds             PT Education - 11/21/17 1055    Education provided  Yes    Education Details  anatomy of condition, POC, HEP, exercise form/rationale, avoid wearing heels/wedges    Person(s) Educated  Patient    Methods  Explanation;Demonstration;Tactile cues;Verbal cues;Handout    Comprehension  Verbalized understanding;Need further instruction;Returned demonstration;Verbal cues required;Tactile cues required          PT Long Term Goals - 11/21/17 1050      PT LONG TERM GOAL #1   Title  Pt will verbalize average pain <=3/10 to decrease pain effects on daily activity    Baseline  6/10 on wong baker scale, verbalized 9/10 prior to being shown scale    Time  6    Period  Weeks    Status  New  Target Date  01/05/18      PT LONG TERM GOAL #2   Title  Gross UE & LE strength to 5/5 for proper support to biomechanical chain    Baseline  see flowsheet    Time  6    Period  Weeks    Status  New    Target Date  01/05/18      PT LONG TERM GOAL #3   Title  Pt will be able to return to use of home exercise equipment     Baseline  unable due to limitaiton by pain at eval    Time  6    Period  Weeks    Status  New    Target Date   01/05/18      PT LONG TERM GOAL #4   Title  Resolution of HA pain    Baseline  frequent at eval    Time  6    Period  Weeks    Status  New    Target Date  01/05/18             Plan - 11/21/17 1044    Clinical Impression Statement  Pt presents to PT with complaints of Rt-sided LBP, Rt elbow and Rt neck/shoulder pain after falling from tripping on a floor mat 2-3 weeks ago. Tightness along lateral Rt thigh but no s/s of radicular pain. Pt with increased lumbar lordosis and pelvic rotation with rounded shoulders and forward head posture. Pt limited by pain in performing exercises today but denied modalities. Pt will benefit from skilled PT in order to improve postural alignment, decrease excessive tightness and reach long term goals.     History and Personal Factors relevant to plan of care:  anxiety, chronic pain    Clinical Presentation  Stable    Clinical Decision Making  Low    Rehab Potential  Good    PT Frequency  2x / week    PT Duration  6 weeks    PT Treatment/Interventions  ADLs/Self Care Home Management;Cryotherapy;Electrical Stimulation;Ultrasound;Traction;Moist Heat;Iontophoresis 4mg /ml Dexamethasone;Stair training;Functional mobility training;Therapeutic activities;Therapeutic exercise;Patient/family education;Neuromuscular re-education;Manual techniques;Passive range of motion;Taping;Dry needling    PT Next Visit Plan  gross LE and UE stretching, review postural alignment    PT Home Exercise Plan  scapular retraction, upper trap & levator stretch, LTR, post pelvic tilt with LE adduction, isometric Lt hip flexion    Consulted and Agree with Plan of Care  Patient       Patient will benefit from skilled therapeutic intervention in order to improve the following deficits and impairments:  Pain, Improper body mechanics, Increased muscle spasms, Postural dysfunction, Decreased strength, Decreased range of motion, Decreased activity tolerance, Difficulty walking  Visit  Diagnosis: Cervicalgia - Plan: PT plan of care cert/re-cert  Acute pain of right shoulder - Plan: PT plan of care cert/re-cert  Acute right-sided low back pain without sciatica - Plan: PT plan of care cert/re-cert  Pain in right leg - Plan: PT plan of care cert/re-cert     Problem List There are no active problems to display for this patient.  Aylah Yeary C. Shewanda Sharpe PT, DPT 11/21/17 10:57 AM   [facility] [address] [place], [place], [address] Phone: [phone]   Fax:  [phone]  Name: Doll Frazee MRN: [id number] Date of Birth: 1961/01/16

## 2017-11-23 ENCOUNTER — Telehealth: Payer: Self-pay | Admitting: Physical Therapy

## 2017-11-23 ENCOUNTER — Ambulatory Visit: Payer: Medicaid Other | Admitting: Physical Therapy

## 2017-11-23 NOTE — Telephone Encounter (Signed)
Called patient about missed visit.  She thought her appointment was at 10:00 am.  She said she told the front office she could not attend early appointments.  I informed her of the next date and time for her next appointment (11/27/17 at 2:15)  I asked her to call if she is unable to attend.  Patient agreed. Liz BeachKaren Maiah Sinning PTA

## 2017-11-27 ENCOUNTER — Ambulatory Visit: Payer: Medicaid Other | Admitting: Physical Therapy

## 2017-11-28 ENCOUNTER — Ambulatory Visit: Payer: Medicaid Other | Admitting: Physical Therapy

## 2017-11-28 DIAGNOSIS — M545 Low back pain, unspecified: Secondary | ICD-10-CM

## 2017-11-28 DIAGNOSIS — M25511 Pain in right shoulder: Secondary | ICD-10-CM

## 2017-11-28 DIAGNOSIS — M79604 Pain in right leg: Secondary | ICD-10-CM

## 2017-11-28 DIAGNOSIS — M542 Cervicalgia: Secondary | ICD-10-CM

## 2017-11-28 NOTE — Therapy (Signed)
[place], [place], [address] Phone: [phone]   Fax:  [phone]  Physical Therapy Treatment  Patient Details  Name: Misty Manning MRN: [id number] Date of Birth: August 20, 1961 Referring Provider: Nolene Ebbs, MD   Encounter Date: 11/28/2017  PT End of Session - 11/28/17 1841    Visit Number  2    Number of Visits  13    Date for PT Re-Evaluation  01/05/18    PT Start Time  1020    PT Stop Time  1058    PT Time Calculation (min)  38 min    Activity Tolerance  Patient limited by pain    Behavior During Therapy  [facility] for tasks assessed/performed;Agitated       Past Medical History:  Diagnosis Date  . Anxiety   . Chronic pain   . High cholesterol   . Hypertension     Past Surgical History:  Procedure Laterality Date  . NO PAST SURGERIES      There were no vitals filed for this visit.  Subjective Assessment - 11/28/17 1835    Subjective  Pain 8-9/10  today.  I want to work on my back more today.    Currently in Pain?  Yes    Pain Score  8     Pain Location  Back    Pain Orientation  Right;Lower    Pain Descriptors / Indicators  Aching    Aggravating Factors   walking weightbearing,  rolling,  sitting too long    Pain Relieving Factors  pillows   change of position    Pain Score  -- no number given    Pain Location  Neck    Pain Orientation  Right    Pain Descriptors / Indicators  Aching    Pain Type  Acute pain    Pain Radiating Towards       Aggravating Factors   she did not say    Pain Relieving Factors  she did not say                      [facility] Adult PT Treatment/Exercise - 11/28/17 0001      Knee/Hip Exercises: Stretches   Passive Hamstring Stretch  3 reps;30 seconds decreased ROM  guarding,  mild     Quad Stretch  2 reps;30 seconds off edge of mat    Hip Flexor Stretch  3 reps;30 seconds tight   small movements,  guarding    Piriformis Stretch  2 reps;10  seconds sm all motiond painful      Knee/Hip Exercises: Supine   Other Supine Knee/Hip Exercises  isometric Lt hip flexion  also add hips in hooklying ball squeeze 10 x    Other Supine Knee/Hip Exercises  bent knee lifts left only      Modalities   Modalities  Moist Heat      Moist Heat Therapy   Number Minutes Moist Heat  -- concurrent with the stretching exercises.    Moist Heat Location  Hip             PT Education - 11/28/17 1841    Education provided  Yes    Education Details  How to stretch correctly    Person(s) Educated  Patient    Methods  Explanation    Comprehension  Verbalized understanding          PT Long Term Goals - 11/21/17 1050  PT LONG TERM GOAL #1   Title  Pt will verbalize average pain <=3/10 to decrease pain effects on daily activity    Baseline  6/10 on wong baker scale, verbalized 9/10 prior to being shown scale    Time  6    Period  Weeks    Status  New    Target Date  01/05/18      PT LONG TERM GOAL #2   Title  Gross UE & LE strength to 5/5 for proper support to biomechanical chain    Baseline  see flowsheet    Time  6    Period  Weeks    Status  New    Target Date  01/05/18      PT LONG TERM GOAL #3   Title  Pt will be able to return to use of home exercise equipment     Baseline  unable due to limitaiton by pain at eval    Time  6    Period  Weeks    Status  New    Target Date  01/05/18      PT LONG TERM GOAL #4   Title  Resolution of HA pain    Baseline  frequent at eval    Time  6    Period  Weeks    Status  New    Target Date  01/05/18            Plan - 11/28/17 1842    Clinical Impression Statement  Patient agitated initially due to arriving early and having to wait for PTA.  Focus was on low back at patient's reguest.  Pelvic rotation continued today.  however she was able to use muscle energy to assist.  She has 8/10 pain at end of session ,  and she felt better than wahen she arrived.    No new goals  met.  Improved weight bearing with gait noted at end of session.    PT Next Visit Plan  gross LE and UE stretching, review postural alignment    PT Home Exercise Plan  scapular retraction, upper trap & levator stretch, LTR, post pelvic tilt with LE adduction, isometric Lt hip flexion    Consulted and Agree with Plan of Care  Patient       Patient will benefit from skilled therapeutic intervention in order to improve the following deficits and impairments:     Visit Diagnosis: Cervicalgia  Acute pain of right shoulder  Acute right-sided low back pain without sciatica  Pain in right leg     Problem List There are no active problems to display for this patient.   HARRIS,KAREN PTA 11/28/2017, 6:48 PM  [facility] [address] [place], [place], [address] Phone: [phone]   Fax:  [phone]  Name: Misty Manning MRN: [id number] Date of Birth: Jul 12, 1961

## 2017-11-29 ENCOUNTER — Encounter: Payer: Self-pay | Admitting: Physical Therapy

## 2017-11-29 ENCOUNTER — Ambulatory Visit: Payer: Medicaid Other | Admitting: Physical Therapy

## 2017-11-29 DIAGNOSIS — M25511 Pain in right shoulder: Secondary | ICD-10-CM

## 2017-11-29 DIAGNOSIS — M545 Low back pain, unspecified: Secondary | ICD-10-CM

## 2017-11-29 DIAGNOSIS — M542 Cervicalgia: Secondary | ICD-10-CM | POA: Diagnosis not present

## 2017-11-29 DIAGNOSIS — M79604 Pain in right leg: Secondary | ICD-10-CM

## 2017-11-29 NOTE — Therapy (Signed)
[facility] [address] [place], [place], [address] Phone: [phone]   Fax:  [phone]  Physical Therapy Treatment  Patient Details  Name: Mintie Witherington MRN: [id number] Date of Birth: 05-04-1961 Referring Provider: Fleet Contras, MD   Encounter Date: 11/29/2017  PT End of Session - 11/29/17 1148    Visit Number  3    Number of Visits  13    Date for PT Re-Evaluation  01/05/18    Authorization Type  self pay    PT Start Time  1148    PT Stop Time  1224    PT Time Calculation (min)  36 min    Activity Tolerance  Patient tolerated treatment well;Patient limited by pain    Behavior During Therapy  [facility] for tasks assessed/performed       Past Medical History:  Diagnosis Date  . Anxiety   . Chronic pain   . High cholesterol   . Hypertension     Past Surgical History:  Procedure Laterality Date  . NO PAST SURGERIES      There were no vitals filed for this visit.  Subjective Assessment - 11/29/17 1148    Subjective  It's getting there. Has not taking any medications. Low back feels ache and a crick in Lt side of neck. Reports feeling a little better after last visit.     Patient Stated Goals  decrease pain, exercise- has a weight machine "workout thing" in my room    Currently in Pain?  Yes    Pain Score  8     Pain Location  Back    Pain Orientation  Lower;Left    Pain Descriptors / Indicators  Aching                      [facility] Adult PT Treatment/Exercise - 11/29/17 0001      Knee/Hip Exercises: Stretches   Piriformis Stretch  Both;2 reps;20 seconds    Other Knee/Hip Stretches  LTR      Knee/Hip Exercises: Aerobic   Nustep  5 min L6 UE & LE after manual treatment      Knee/Hip Exercises: Supine   Hip Adduction Isometric Limitations  with posterior pelvic tilt frequent cuing not to lift head      Manual Therapy   Manual Therapy  Soft tissue mobilization    Manual therapy comments  edu  in use of tennis ball for trigger point release- 3-5 min per day, bruising may occur and dont push on bruises    Soft tissue mobilization  IASTM & STM Lt QL and paraspinals             PT Education - 11/29/17 1224    Education provided  Yes    Education Details  stand equal on feet, avoid crossing legs, avoid high heels, rationale for manual therapy, use of tennis ball    Person(s) Educated  Patient    Methods  Explanation;Demonstration;Tactile cues;Verbal cues    Comprehension  Verbalized understanding;Need further instruction;Returned demonstration;Verbal cues required;Tactile cues required          PT Long Term Goals - 11/21/17 1050      PT LONG TERM GOAL #1   Title  Pt will verbalize average pain <=3/10 to decrease pain effects on daily activity    Baseline  6/10 on wong baker scale, verbalized 9/10 prior to being shown scale    Time  6    Period  Weeks  Status  New    Target Date  01/05/18      PT LONG TERM GOAL #2   Title  Gross UE & LE strength to 5/5 for proper support to biomechanical chain    Baseline  see flowsheet    Time  6    Period  Weeks    Status  New    Target Date  01/05/18      PT LONG TERM GOAL #3   Title  Pt will be able to return to use of home exercise equipment     Baseline  unable due to limitaiton by pain at eval    Time  6    Period  Weeks    Status  New    Target Date  01/05/18      PT LONG TERM GOAL #4   Title  Resolution of HA pain    Baseline  frequent at eval    Time  6    Period  Weeks    Status  New    Target Date  01/05/18            Plan - 11/29/17 1216    Clinical Impression Statement  Pt with significant difficulty relaxing for manual therapy and was educated on anatomy of condition and rationale for manual treatment. Notable spasm in Lt paraspinals esp at L2-3-4 levels. Notable difficulty with engaging abdominal wall without carryover to upper body/neck, frequent cuing for form provided. Pt reported feeling  much better after treament today- 7/10 on faces scale    PT Treatment/Interventions  ADLs/Self Care Home Management;Cryotherapy;Electrical Stimulation;Ultrasound;Traction;Moist Heat;Iontophoresis 4mg /ml Dexamethasone;Stair training;Functional mobility training;Therapeutic activities;Therapeutic exercise;Patient/family education;Neuromuscular re-education;Manual techniques;Passive range of motion;Taping;Dry needling    PT Next Visit Plan  manual to lumbar paraspinals, abdominal strengthening    PT Home Exercise Plan  scapular retraction, upper trap & levator stretch, LTR, post pelvic tilt with LE adduction, isometric Lt hip flexion; tennis ball 3-5 min    Consulted and Agree with Plan of Care  Patient       Patient will benefit from skilled therapeutic intervention in order to improve the following deficits and impairments:  Pain, Improper body mechanics, Increased muscle spasms, Postural dysfunction, Decreased strength, Decreased range of motion, Decreased activity tolerance, Difficulty walking  Visit Diagnosis: Cervicalgia  Acute pain of right shoulder  Acute right-sided low back pain without sciatica  Pain in right leg     Problem List There are no active problems to display for this patient.  Raye Wiens C. Siobahn Worsley PT, DPT 11/29/17 12:43 PM   [facility] [address] [place], [place], [address] Phone: [phone]   Fax:  [phone]  Name: Alphonsus SiasLoretta Charon MRN: [id number] Date of Birth: 16-Nov-1960

## 2017-12-05 ENCOUNTER — Ambulatory Visit: Payer: Medicaid Other | Admitting: Physical Therapy

## 2017-12-05 ENCOUNTER — Encounter: Payer: Self-pay | Admitting: Physical Therapy

## 2017-12-05 DIAGNOSIS — M542 Cervicalgia: Secondary | ICD-10-CM | POA: Diagnosis not present

## 2017-12-05 DIAGNOSIS — M545 Low back pain, unspecified: Secondary | ICD-10-CM

## 2017-12-05 DIAGNOSIS — M25511 Pain in right shoulder: Secondary | ICD-10-CM

## 2017-12-05 DIAGNOSIS — M79604 Pain in right leg: Secondary | ICD-10-CM

## 2017-12-05 NOTE — Therapy (Signed)
[facility] [address] [place], [place], [address] Phone: [phone]   Fax:  [phone]  Physical Therapy Treatment  Patient Details  Name: Misty Manning MRN: [id number] Date of Birth: November 08, 1960 Referring Provider: Fleet Contras, MD   Encounter Date: 12/05/2017  PT End of Session - 12/05/17 1001    Visit Number  4    Number of Visits  13    Date for PT Re-Evaluation  01/05/18    Authorization Type  self pay    PT Start Time  1000    PT Stop Time  1040    PT Time Calculation (min)  40 min    Activity Tolerance  Patient tolerated treatment well    Behavior During Therapy  [facility] for tasks assessed/performed       Past Medical History:  Diagnosis Date  . Anxiety   . Chronic pain   . High cholesterol   . Hypertension     Past Surgical History:  Procedure Laterality Date  . NO PAST SURGERIES      There were no vitals filed for this visit.  Subjective Assessment - 12/05/17 1001    Subjective  Reports being sore after last visit but it felt good. Still has Left-sided pain but it has lightened up a little bit. Still has 8/10 pain today but reports she did not take pain meds like she had at her last appointment.     Patient Stated Goals  decrease pain, exercise- has a weight machine "workout thing" in my room    Currently in Pain?  Yes    Pain Score  8     Pain Location  Back    Pain Orientation  Lower;Left    Pain Descriptors / Indicators  Sore                      [facility] Adult PT Treatment/Exercise - 12/05/17 0001      Knee/Hip Exercises: Stretches   Piriformis Stretch  Both;30 seconds    Other Knee/Hip Stretches  SKTC 2x30s ea, DKTC 2x30s    Other Knee/Hip Stretches  LTR      Knee/Hip Exercises: Aerobic   Nustep  5 min L5 UE & LE      Knee/Hip Exercises: Supine   Bridges with Ball Squeeze  10 reps;2 sets    Other Supine Knee/Hip Exercises  large ball abdominal isometrics middle & obliques     Other Supine Knee/Hip Exercises  table top holds      Manual Therapy   Soft tissue mobilization  IASTM & STM to Lt-sided paraspinals, QL, glut max prone over pillows      Neck Exercises: Stretches   Other Neck Stretches  seated physioball roll out center & lateral                  PT Long Term Goals - 11/21/17 1050      PT LONG TERM GOAL #1   Title  Pt will verbalize average pain <=3/10 to decrease pain effects on daily activity    Baseline  6/10 on wong baker scale, verbalized 9/10 prior to being shown scale    Time  6    Period  Weeks    Status  New    Target Date  01/05/18      PT LONG TERM GOAL #2   Title  Gross UE & LE strength to 5/5 for proper support to biomechanical chain    Baseline  see flowsheet    Time  6    Period  Weeks    Status  New    Target Date  01/05/18      PT LONG TERM GOAL #3   Title  Pt will be able to return to use of home exercise equipment     Baseline  unable due to limitaiton by pain at eval    Time  6    Period  Weeks    Status  New    Target Date  01/05/18      PT LONG TERM GOAL #4   Title  Resolution of HA pain    Baseline  frequent at eval    Time  6    Period  Weeks    Status  New    Target Date  01/05/18            Plan - 12/05/17 1040    Clinical Impression Statement  Continued pain along Lt lumbar paraspinals and pt frequently lifted her head or bent her knee during manual therapy which tensed the muscle. Focused on activation of abdominal wall today to shift muscle use. Pt verbalized fatigue without increased pain following treatment.     PT Treatment/Interventions  ADLs/Self Care Home Management;Cryotherapy;Electrical Stimulation;Ultrasound;Traction;Moist Heat;Iontophoresis 4mg /ml Dexamethasone;Stair training;Functional mobility training;Therapeutic activities;Therapeutic exercise;Patient/family education;Neuromuscular re-education;Manual techniques;Passive range of motion;Taping;Dry needling    PT Next Visit  Plan  continue abdominal strengthening, manual to paraspinals as tolerated    PT Home Exercise Plan  scapular retraction, upper trap & levator stretch, LTR, post pelvic tilt with LE adduction, isometric Lt hip flexion; tennis ball 3-5 min; hooklying iso press into pillows, bridge with pillow squeeze, table top holds    Consulted and Agree with Plan of Care  Patient       Patient will benefit from skilled therapeutic intervention in order to improve the following deficits and impairments:  Pain, Improper body mechanics, Increased muscle spasms, Postural dysfunction, Decreased strength, Decreased range of motion, Decreased activity tolerance, Difficulty walking  Visit Diagnosis: Cervicalgia  Acute pain of right shoulder  Acute right-sided low back pain without sciatica  Pain in right leg     Problem List There are no active problems to display for this patient.   Issai Werling C. Ameliyah Sarno PT, DPT 12/05/17 10:43 AM   [facility] [address] [place], [place], [address] Phone: [phone]   Fax:  [phone]  Name: Misty Manning MRN: [id number] Date of Birth: 04/21/1961

## 2017-12-07 ENCOUNTER — Encounter: Payer: Self-pay | Admitting: Physical Therapy

## 2017-12-07 ENCOUNTER — Ambulatory Visit: Payer: Medicaid Other | Admitting: Physical Therapy

## 2017-12-07 DIAGNOSIS — M79604 Pain in right leg: Secondary | ICD-10-CM

## 2017-12-07 DIAGNOSIS — M25511 Pain in right shoulder: Secondary | ICD-10-CM

## 2017-12-07 DIAGNOSIS — M545 Low back pain, unspecified: Secondary | ICD-10-CM

## 2017-12-07 DIAGNOSIS — M542 Cervicalgia: Secondary | ICD-10-CM

## 2017-12-07 NOTE — Therapy (Signed)
[facility] [address] [place], [place], [address] Phone: [phone]   Fax:  [phone]  Physical Therapy Treatment  Patient Details  Name: Misty Manning MRN: [id number] Date of Birth: 1961/06/23 Referring Provider: Fleet ContrasAvbuere, Edwin, MD   Encounter Date: 12/07/2017  PT End of Session - 12/07/17 1118    Visit Number  5    Number of Visits  13    Date for PT Re-Evaluation  01/05/18    PT Start Time  1020 Charge will not equal time slot due to patient's personal reasons    PT Stop Time  1100    PT Time Calculation (min)  40 min    Activity Tolerance  Patient tolerated treatment well    Behavior During Therapy  [facility] for tasks assessed/performed       Past Medical History:  Diagnosis Date  . Anxiety   . Chronic pain   . High cholesterol   . Hypertension     Past Surgical History:  Procedure Laterality Date  . NO PAST SURGERIES      There were no vitals filed for this visit.  Subjective Assessment - 12/07/17 1020    Subjective  meds have helped shoulder and leg.  back gets tight.  I feel good when i leave PT.   has been doing exercises.     Currently in Pain?  Yes    Pain Score  7     Pain Location  Back    Pain Orientation  Left;Lower    Aggravating Factors   sitting to  long    Pain Relieving Factors  change of position    Pain Score  -- feels ok today    Pain Location  Neck    Pain Orientation  Right    Pain Relieving Factors  meds                      [facility] Adult PT Treatment/Exercise - 12/07/17 0001      Self-Care   Self-Care  Other Self-Care Comments    Other Self-Care Comments   patient tearful today,  she is under a lot of stress.  She said she feels safe.  She has been walking a lot in the rain to find an apartment.  She is all alone.  Info given printed off internet for [employer] [employer],  [employer],  [employer] and 1 other.  Patient was grateful for info      Knee/Hip  Exercises: Stretches   Passive Hamstring Stretch  3 reps;30 seconds    Piriformis Stretch  Both;30 seconds    Other Knee/Hip Stretches  double knee to chest,  legs on ball,  lower trunk rotations .  Legs on ball  ROM gradually improved      Knee/Hip Exercises: Supine   Straight Leg Raises  5 reps with abdominal bracing with leg lift off ball,  small motion      Knee/Hip Exercises: Prone   Other Prone Exercises  multifitus press, X 5 and press with knee flexion   cued technique      Modalities   Modalities  Moist Heat      Manual Therapy   Manual Therapy  Soft tissue mobilization    Soft tissue mobilization  IASTM & STM to Lt-sided paraspinals, QL, glut max prone over pillows left scapular  area,  teres softened             PT Education -  12/07/17 1118    Education provided  Yes    Education Details  Self care    Person(s) Educated  Patient    Methods  Handout    Comprehension  Verbalized understanding          PT Long Term Goals - 11/21/17 1050      PT LONG TERM GOAL #1   Title  Pt will verbalize average pain <=3/10 to decrease pain effects on daily activity    Baseline  6/10 on wong baker scale, verbalized 9/10 prior to being shown scale    Time  6    Period  Weeks    Status  New    Target Date  01/05/18      PT LONG TERM GOAL #2   Title  Gross UE & LE strength to 5/5 for proper support to biomechanical chain    Baseline  see flowsheet    Time  6    Period  Weeks    Status  New    Target Date  01/05/18      PT LONG TERM GOAL #3   Title  Pt will be able to return to use of home exercise equipment     Baseline  unable due to limitaiton by pain at eval    Time  6    Period  Weeks    Status  New    Target Date  01/05/18      PT LONG TERM GOAL #4   Title  Resolution of HA pain    Baseline  frequent at eval    Time  6    Period  Weeks    Status  New    Target Date  01/05/18            Plan - 12/07/17 1119    Clinical Impression Statement   Patient under stress today with tears during session.  Info given for [employer].  Spasms decreased with manual  and gentle stretches from shoulder to hip .  Hip IR/ER ROM improved 50 % post session.      PT Next Visit Plan  continue abdominal strengthening, manual to paraspinals as tolerated    PT Home Exercise Plan  scapular retraction, upper trap & levator stretch, LTR, post pelvic tilt with LE adduction, isometric Lt hip flexion; tennis ball 3-5 min; hooklying iso press into pillows, bridge with pillow squeeze, table top holds    Consulted and Agree with Plan of Care  Patient       Patient will benefit from skilled therapeutic intervention in order to improve the following deficits and impairments:     Visit Diagnosis: Cervicalgia  Acute pain of right shoulder  Acute right-sided low back pain without sciatica  Pain in right leg     Problem List There are no active problems to display for this patient.   HARRIS,KAREN  PTA 12/07/2017, 11:24 AM  [facility] [address] [place], [place], [address] Phone: [phone]   Fax:  [phone]  Name: Misty Manning MRN: [id number] Date of Birth: 09/22/1961

## 2017-12-12 ENCOUNTER — Ambulatory Visit: Payer: Medicaid Other | Admitting: Physical Therapy

## 2017-12-13 ENCOUNTER — Ambulatory Visit: Payer: Self-pay

## 2017-12-14 ENCOUNTER — Ambulatory Visit: Payer: Medicaid Other | Admitting: Physical Therapy

## 2017-12-19 ENCOUNTER — Ambulatory Visit: Payer: Medicaid Other | Attending: Internal Medicine | Admitting: Physical Therapy

## 2017-12-19 ENCOUNTER — Encounter: Payer: Self-pay | Admitting: Physical Therapy

## 2017-12-19 DIAGNOSIS — M25511 Pain in right shoulder: Secondary | ICD-10-CM | POA: Insufficient documentation

## 2017-12-19 DIAGNOSIS — M542 Cervicalgia: Secondary | ICD-10-CM | POA: Diagnosis present

## 2017-12-19 DIAGNOSIS — M79604 Pain in right leg: Secondary | ICD-10-CM | POA: Insufficient documentation

## 2017-12-19 DIAGNOSIS — M545 Low back pain, unspecified: Secondary | ICD-10-CM

## 2017-12-19 NOTE — Therapy (Signed)
[facility] [address] [place], [place], [address] Phone: [phone]   Fax:  [phone]  Physical Therapy Treatment  Patient Details  Name: Misty Manning MRN: [id number] Date of Birth: [birth year] Referring Provider: Fleet ContrasAvbuere, Edwin, MD   Encounter Date: 12/19/2017  PT End of Session - 12/19/17 1021    Visit Number  6    Number of Visits  13    Date for PT Re-Evaluation  01/05/18    Authorization Type  self pay    PT Start Time  1021 pt arrived late    PT Stop Time  1056    PT Time Calculation (min)  35 min       Past Medical History:  Diagnosis Date  . Anxiety   . Chronic pain   . High cholesterol   . Hypertension     Past Surgical History:  Procedure Laterality Date  . NO PAST SURGERIES      There were no vitals filed for this visit.  Subjective Assessment - 12/19/17 1021    Subjective  Feeling alright. I have been working on my exercises at home, doing well. I am still a little sore but am sleeping better.     Patient Stated Goals  decrease pain, exercise- has a weight machine "workout thing" in my room    Currently in Pain?  Yes    Pain Score  5     Pain Location  Back    Pain Orientation  Lower    Pain Descriptors / Indicators  Sore    Aggravating Factors   laying supine    Pain Relieving Factors  change position    Pain Score  0    Pain Location  Neck                      [facility] Adult PT Treatment/Exercise - 12/19/17 0001      Knee/Hip Exercises: Stretches   Passive Hamstring Stretch  Both;2 reps;30 seconds    Piriformis Stretch  Both;30 seconds    Gastroc Stretch  Both;30 seconds slant board      Knee/Hip Exercises: Aerobic   Elliptical  3 min L1 ramp 2    Nustep  5 min L5 UE & LE      Manual Therapy   Manual therapy comments  edu in use of tennis ball at wall    Soft tissue mobilization  IASTM & trigger point release Lt paraspinals, QL, glut max             PT  Education - 12/19/17 1056    Education provided  Yes    Education Details  exercise form/rationale, self trigger point release, POC    Person(s) Educated  Patient    Methods  Explanation;Demonstration;Tactile cues;Verbal cues;Handout    Comprehension  Verbalized understanding;Need further instruction;Returned demonstration;Verbal cues required;Tactile cues required          PT Long Term Goals - 11/21/17 1050      PT LONG TERM GOAL #1   Title  Pt will verbalize average pain <=3/10 to decrease pain effects on daily activity    Baseline  6/10 on wong baker scale, verbalized 9/10 prior to being shown scale    Time  6    Period  Weeks    Status  New    Target Date  01/05/18      PT LONG TERM GOAL #2   Title  Gross UE & LE strength to  5/5 for proper support to biomechanical chain    Baseline  see flowsheet    Time  6    Period  Weeks    Status  New    Target Date  01/05/18      PT LONG TERM GOAL #3   Title  Pt will be able to return to use of home exercise equipment     Baseline  unable due to limitaiton by pain at eval    Time  6    Period  Weeks    Status  New    Target Date  01/05/18      PT LONG TERM GOAL #4   Title  Resolution of HA pain    Baseline  frequent at eval    Time  6    Period  Weeks    Status  New    Target Date  01/05/18            Plan - 12/19/17 1037    Clinical Impression Statement  concordant pain released in glut max trigger point and pt reported feeling "so much better"   will continue to strengthen abdominal wall and educate in proper stretching.     PT Treatment/Interventions  ADLs/Self Care Home Management;Cryotherapy;Electrical Stimulation;Ultrasound;Traction;Moist Heat;Iontophoresis 4mg /ml Dexamethasone;Stair training;Functional mobility training;Therapeutic activities;Therapeutic exercise;Patient/family education;Neuromuscular re-education;Manual techniques;Passive range of motion;Taping;Dry needling    PT Next Visit Plan  eliptical,  sit<>stand, qped abdominal exercises    PT Home Exercise Plan  scapular retraction, upper trap & levator stretch, LTR, post pelvic tilt with LE adduction, isometric Lt hip flexion; tennis ball 3-5 min; hooklying iso press into pillows, bridge with pillow squeeze, table top holds; seated HSS, piriformis stretch    Consulted and Agree with Plan of Care  Patient       Patient will benefit from skilled therapeutic intervention in order to improve the following deficits and impairments:  Pain, Improper body mechanics, Increased muscle spasms, Postural dysfunction, Decreased strength, Decreased range of motion, Decreased activity tolerance, Difficulty walking  Visit Diagnosis: Cervicalgia  Acute pain of right shoulder  Acute right-sided low back pain without sciatica  Pain in right leg     Problem List There are no active problems to display for this patient.  Gatha Mcnulty C. Iann Rodier PT, DPT 12/19/17 10:59 AM   [facility] [address] [place], [place], [address] Phone: [phone]   Fax:  [phone]  Name: Misty Manning MRN: [id number] Date of Birth: 07-07-1961

## 2017-12-21 ENCOUNTER — Ambulatory Visit: Payer: Medicaid Other | Admitting: Physical Therapy

## 2017-12-21 ENCOUNTER — Encounter: Payer: Self-pay | Admitting: Physical Therapy

## 2017-12-21 DIAGNOSIS — M25511 Pain in right shoulder: Secondary | ICD-10-CM

## 2017-12-21 DIAGNOSIS — M545 Low back pain, unspecified: Secondary | ICD-10-CM

## 2017-12-21 DIAGNOSIS — M542 Cervicalgia: Secondary | ICD-10-CM

## 2017-12-21 DIAGNOSIS — M79604 Pain in right leg: Secondary | ICD-10-CM

## 2017-12-21 NOTE — Therapy (Signed)
[place], [place], [address] Phone: [phone]   Fax:  [phone]  Physical Therapy Treatment  Patient Details  Name: Misty Manning MRN: [id number] Date of Birth: [birth year] Referring Provider: Nolene Ebbs, MD   Encounter Date: 12/21/2017  PT End of Session - 12/21/17 1100    Visit Number  7    Number of Visits  13    Date for PT Re-Evaluation  01/05/18    PT Start Time  1018    PT Stop Time  1100    PT Time Calculation (min)  42 min    Activity Tolerance  Patient tolerated treatment well    Behavior During Therapy  [facility] for tasks assessed/performed       Past Medical History:  Diagnosis Date  . Anxiety   . Chronic pain   . High cholesterol   . Hypertension     Past Surgical History:  Procedure Laterality Date  . NO PAST SURGERIES      There were no vitals filed for this visit.  Subjective Assessment - 12/21/17 1025    Subjective  I feel good today.  the massage took the pain away.  i did not know muscles could hurt so much.  Ialso  took some medication.  I want to try the elliptical.      Currently in Pain?  No/denies    Pain Score  0-No pain    Pain Location  Back    Pain Score  0    Pain Location  Neck    Pain Orientation  Right    Aggravating Factors   Stress    Pain Relieving Factors  Meds.                       [facility] Adult PT Treatment/Exercise - 12/21/17 0001      Self-Care   Self-Care  ADL's    ADL's  reviewed ADH handout,  demo son      Neck Exercises: Machines for Strengthening   Nustep  5 minutes L5  UE/LE    Other Machines for Strengthening  Life step L1-2 for 8 floors      Lumbar Exercises: Prone   Single Arm Raise  5 reps 2 pillows, cued core  HEP    Straight Leg Raise  5 reps    Straight Leg Raises Limitations  cues core/technique  HEP    Opposite Arm/Leg Raise  5 reps    Opposite Arm/Leg Raise Limitations  cued technique   HEP challanging       Lumbar Exercises: Quadruped   Madcat/Old Horse  5 reps 10 second holds  mod + cues    Madcat/Old Horse Limitations  stiff both ranges lumbar,  no pain    Single Arm Raise  5 reps cues    Straight Leg Raise  5 reps wobbles,  cued for core control    Opposite Arm/Leg Raise  Right arm/Left leg;Left arm/Right leg    Opposite Arm/Leg Raise Limitations  wobbles , poor control trunk,  has knee pain  pillow used under knees      Knee/Hip Exercises: Stretches   Passive Hamstring Stretch  Both;2 reps;30 seconds    Piriformis Stretch  Both;30 seconds    Other Knee/Hip Stretches  prone left hip IR/ER stretches with knee flexed 20 seconds each  ER WNL in this position.       Knee/Hip Exercises: Aerobic   Elliptical  1.5  minutes limited by fatigue ramp 2 ,  L1    Nustep  5 min L5 UE & LE    Other Aerobic  Life step 8 floors level 1-2.   limited by fatigue             PT Education - 12/21/17 1054    Education provided  Yes    Education Details  HEP,  ADL    Person(s) Educated  Patient    Methods  Explanation;Demonstration;Tactile cues;Verbal cues;Handout    Comprehension  Verbalized understanding;Returned demonstration          PT Long Term Goals - 12/21/17 1220      PT LONG TERM GOAL #1   Title  Pt will verbalize average pain <=3/10 to decrease pain effects on daily activity    Baseline  no pain today.  Consistant    Time  6    Period  Weeks    Status  Partially Met      PT LONG TERM GOAL #2   Title  Gross UE & LE strength to 5/5 for proper support to biomechanical chain    Time  6    Period  Weeks    Status  Unable to assess      PT LONG TERM GOAL #3   Title  Pt will be able to return to use of home exercise equipment     Time  6    Period  Weeks    Status  Unable to assess      PT LONG TERM GOAL #4   Title  Resolution of HA pain    Baseline  no HA today  Consistant?    Time  6    Period  Weeks    Status  Partially Met            Plan - 12/21/17 1100     Clinical Impression Statement  Pain gluteal gone with increased hip rotation noted in ER left.  LTG# 1, #4 partially met.  Able to progress her HEP.  She continues to need cues for ADL for posture.  Quadriped exercises difficult and hurt her knees so prone added to HEP after practice.     PT Next Visit Plan  eliptical, sit<>stand, qped abdominal exercises.  review new    PT Home Exercise Plan  scapular retraction, upper trap & levator stretch, LTR, post pelvic tilt with LE adduction, isometric Lt hip flexion; tennis ball 3-5 min; hooklying iso press into pillows, bridge with pillow squeeze, table top holds; seated HSS, piriformis stretchProne strength with bracing,  single arms/legs and opposite lifts.      Consulted and Agree with Plan of Care  Patient       Patient will benefit from skilled therapeutic intervention in order to improve the following deficits and impairments:     Visit Diagnosis: Cervicalgia  Acute pain of right shoulder  Acute right-sided low back pain without sciatica  Pain in right leg     Problem List There are no active problems to display for this patient.   HARRIS,KAREN  PTA 12/21/2017, 12:23 PM  [facility] [address] [place], [place], [address] Phone: [phone]   Fax:  [phone]  Name: Misty Manning MRN: [id number] Date of Birth: 06/10/1961

## 2017-12-21 NOTE — Patient Instructions (Signed)
Arm / Leg Lift: Opposite (Prone)    Lift right leg and opposite arm _2-3___ inches from floor, keeping knee locked. Repeat ___5-10_ times per set. Do _1 to 3___ sets per session. Do _1___ sessions per day.    http://orth.exer.us/101   Copyright  VHI. All rights reserved.  Bracing With Arm Lift (Prone)    With pillow support, lie on abdomen. Find neutral spine. Tighten pelvic floor and abdominals and hold . Alternately raise arms off floor. Repeat 5-10___ times. Do _1__ times a day.    Bracing With Leg Lift (Prone)    With pillow support, lie on abdomen and neutral spine, tighten pelvic floor and abdominals and hold. Alternately raise legs off floor. Repeat _5-10__ times. Do _1__ times a day.     Copyright  VHI. All rights reserved.  Sleeping on Back  Place pillow under knees. A pillow with cervical support and a roll around waist are also helpful. Copyright  VHI. All rights reserved.  Sleeping on Side Place pillow between knees. Use cervical support under neck and a roll around waist as needed. Copyright  VHI. All rights reserved.   Sleeping on Stomach   If this is the only desirable sleeping position, place pillow under lower legs, and under stomach or chest as needed.  Posture - Sitting   Sit upright, head facing forward. Try using a roll to support lower back. Keep shoulders relaxed, and avoid rounded back. Keep hips level with knees. Avoid crossing legs for long periods. Stand to Sit / Sit to Stand   To sit: Bend knees to lower self onto front edge of chair, then scoot back on seat. To stand: Reverse sequence by placing one foot forward, and scoot to front of seat. Use rocking motion to stand up.   Work Height and Reach  Ideal work height is no more than 2 to 4 inches below elbow level when standing, and at elbow level when sitting. Reaching should be limited to arm's length, with elbows slightly bent.  Bending  Bend at hips and knees, not back. Keep  feet shoulder-width apart.    Posture - Standing   Good posture is important. Avoid slouching and forward head thrust. Maintain curve in low back and align ears over shoul- ders, hips over ankles.  Alternating Positions   Alternate tasks and change positions frequently to reduce fatigue and muscle tension. Take rest breaks. Computer Work   Position work to Art gallery manager. Use proper work and seat height. Keep shoulders back and down, wrists straight, and elbows at right angles. Use chair that provides full back support. Add footrest and lumbar roll as needed.  Getting Into / Out of Car  Lower self onto seat, scoot back, then bring in one leg at a time. Reverse sequence to get out.  Dressing  Lie on back to pull socks or slacks over feet, or sit and bend leg while keeping back straight.    Housework - Sink  Place one foot on ledge of cabinet under sink when standing at sink for prolonged periods.   Pushing / Pulling  Pushing is preferable to pulling. Keep back in proper alignment, and use leg muscles to do the work.  Deep Squat   Squat and lift with both arms held against upper trunk. Tighten stomach muscles without holding breath. Use smooth movements to avoid jerking.  Avoid Twisting   Avoid twisting or bending back. Pivot around using foot movements, and bend at knees if needed when reaching for  articles.  Carrying Luggage   Distribute weight evenly on both sides. Use a cart whenever possible. Do not twist trunk. Move body as a unit.   Lifting Principles .Maintain proper posture and head alignment. .Slide object as close as possible before lifting. .Move obstacles out of the way. .Test before lifting; ask for help if too heavy. .Tighten stomach muscles without holding breath. .Use smooth movements; do not jerk. .Use legs to do the work, and pivot with feet. .Distribute the work load symmetrically and close to the center of trunk. .Push instead of pull  whenever possible.   Ask For Help   Ask for help and delegate to others when possible. Coordinate your movements when lifting together, and maintain the low back curve.  Log Roll   Lying on back, bend left knee and place left arm across chest. Roll all in one movement to the right. Reverse to roll to the left. Always move as one unit. Housework - Sweeping  Use long-handled equipment to avoid stooping.   Housework - Wiping  Position yourself as close as possible to reach work surface. Avoid straining your back.  Laundry - Unloading Wash   To unload small items at bottom of washer, lift leg opposite to arm being used to reach.  Gardening - Raking  Move close to area to be raked. Use arm movements to do the work. Keep back straight and avoid twisting.     Cart  When reaching into cart with one arm, lift opposite leg to keep back straight.   Getting Into / Out of Bed  Lower self to lie down on one side by raising legs and lowering head at the same time. Use arms to assist moving without twisting. Bend both knees to roll onto back if desired. To sit up, start from lying on side, and use same move-ments in reverse. Housework - Vacuuming  Hold the vacuum with arm held at side. Step back and forth to move it, keeping head up. Avoid twisting.   Laundry - Armed forces training and education officerLoading Wash  Position laundry basket so that bending and twisting can be avoided.   Laundry - Unloading Dryer  Squat down to reach into clothes dryer or use a reacher.  Gardening - Weeding / Psychiatric nurselanting  Squat or Kneel. Knee pads may be helpful.

## 2017-12-26 ENCOUNTER — Encounter: Payer: Self-pay | Admitting: Physical Therapy

## 2017-12-26 ENCOUNTER — Ambulatory Visit: Payer: Medicaid Other | Admitting: Physical Therapy

## 2017-12-26 DIAGNOSIS — M542 Cervicalgia: Secondary | ICD-10-CM | POA: Diagnosis not present

## 2017-12-26 DIAGNOSIS — M79604 Pain in right leg: Secondary | ICD-10-CM

## 2017-12-26 DIAGNOSIS — M545 Low back pain, unspecified: Secondary | ICD-10-CM

## 2017-12-26 DIAGNOSIS — M25511 Pain in right shoulder: Secondary | ICD-10-CM

## 2017-12-26 NOTE — Therapy (Signed)
[place], [place], [address] Phone: [phone]   Fax:  [phone]  Physical Therapy Treatment  Patient Details  Name: Misty Manning MRN: [id number] Date of Birth: [birth year] Referring Provider: Nolene Ebbs, MD   Encounter Date: 12/26/2017  PT End of Session - 12/26/17 1020    Visit Number  8    Number of Visits  13    Date for PT Re-Evaluation  01/05/18    Authorization Type  self pay    PT Start Time  1018    PT Stop Time  1056    PT Time Calculation (min)  38 min    Activity Tolerance  Patient tolerated treatment well    Behavior During Therapy  [facility] for tasks assessed/performed       Past Medical History:  Diagnosis Date  . Anxiety   . Chronic pain   . High cholesterol   . Hypertension     Past Surgical History:  Procedure Laterality Date  . NO PAST SURGERIES      There were no vitals filed for this visit.  Subjective Assessment - 12/26/17 1020    Subjective  I am feeling good. I still have a really deep soreness right where I fell- rubbing the post/lateral side of Lt hip. Neck feels good, denies limitation.     Patient Stated Goals  decrease pain, exercise- has a weight machine "workout thing" in my room    Currently in Pain?  Yes    Pain Score  5     Pain Location  Hip    Pain Orientation  Left;Lateral;Posterior    Pain Descriptors / Indicators  Sore    Aggravating Factors   rolling over in the middle of the night    Pain Relieving Factors  heat/ice    Pain Score  0    Pain Location  Neck                      [facility] Adult PT Treatment/Exercise - 12/26/17 0001      Knee/Hip Exercises: Stretches   Passive Hamstring Stretch  Both;30 seconds    Piriformis Stretch  Both;30 seconds seated      Knee/Hip Exercises: Aerobic   Elliptical  3 min L1 ramp2    Nustep  5 min L7 UE & LE      Knee/Hip Exercises: Seated   Sit to Sand  15 reps;without UE support cues  for glut set      Knee/Hip Exercises: Sidelying   Clams  x30 each    Other Sidelying Knee/Hip Exercises  reverse clam x20 each      Knee/Hip Exercises: Prone   Other Prone Exercises  qped alt GHJ flexion; alt hip extension                  PT Long Term Goals - 12/21/17 1220      PT LONG TERM GOAL #1   Title  Pt will verbalize average pain <=3/10 to decrease pain effects on daily activity    Baseline  no pain today.  Consistant    Time  6    Period  Weeks    Status  Partially Met      PT LONG TERM GOAL #2   Title  Gross UE & LE strength to 5/5 for proper support to biomechanical chain    Time  6    Period  Weeks    Status  Unable to assess      PT LONG TERM GOAL #3   Title  Pt will be able to return to use of home exercise equipment     Time  6    Period  Weeks    Status  Unable to assess      PT LONG TERM GOAL #4   Title  Resolution of HA pain    Baseline  no HA today  Consistant?    Time  6    Period  Weeks    Status  Partially Met            Plan - 12/26/17 1102    Clinical Impression Statement  Continued to progress exercises to challenge gluts and core activation. pt verbalized difficulty without increased pain.     PT Treatment/Interventions  ADLs/Self Care Home Management;Cryotherapy;Electrical Stimulation;Ultrasound;Traction;Moist Heat;Iontophoresis '4mg'$ /ml Dexamethasone;Stair training;Functional mobility training;Therapeutic activities;Therapeutic exercise;Patient/family education;Neuromuscular re-education;Manual techniques;Passive range of motion;Taping;Dry needling    PT Next Visit Plan  advance core strengthening HEP, review quadruped    PT Home Exercise Plan  scapular retraction, upper trap & levator stretch, LTR, post pelvic tilt with LE adduction, isometric Lt hip flexion; tennis ball 3-5 min; hooklying iso press into pillows, bridge with pillow squeeze, table top holds; seated HSS, piriformis stretchProne strength with bracing,  single  arms/legs and opposite lifts; qped alt arm/leg lifts, clam & reverse    Consulted and Agree with Plan of Care  Patient       Patient will benefit from skilled therapeutic intervention in order to improve the following deficits and impairments:  Pain, Improper body mechanics, Increased muscle spasms, Postural dysfunction, Decreased strength, Decreased range of motion, Decreased activity tolerance, Difficulty walking  Visit Diagnosis: Cervicalgia  Acute right-sided low back pain without sciatica  Acute pain of right shoulder  Pain in right leg     Problem List There are no active problems to display for this patient.  Mael Delap C. Markee Remlinger PT, DPT 12/26/17 11:06 AM   [place], [place], [address] Phone: [phone]   Fax:  [phone]  Name: Misty Manning MRN: [id number] Date of Birth: 1960-11-29

## 2017-12-28 ENCOUNTER — Encounter: Payer: Self-pay | Admitting: Physical Therapy

## 2017-12-28 ENCOUNTER — Ambulatory Visit: Payer: Medicaid Other | Admitting: Physical Therapy

## 2017-12-28 DIAGNOSIS — M545 Low back pain, unspecified: Secondary | ICD-10-CM

## 2017-12-28 DIAGNOSIS — M542 Cervicalgia: Secondary | ICD-10-CM | POA: Diagnosis not present

## 2017-12-28 DIAGNOSIS — M79604 Pain in right leg: Secondary | ICD-10-CM

## 2017-12-28 DIAGNOSIS — M25511 Pain in right shoulder: Secondary | ICD-10-CM

## 2017-12-28 NOTE — Therapy (Addendum)
[place], [place], [address] Phone: [phone]   Fax:  [phone]  Physical Therapy Treatment/Discharge  Patient Details  Name: Misty Manning MRN: [id number] Date of Birth: 26-Apr-1961 Referring Provider: Nolene Ebbs, MD   Encounter Date: 12/28/2017  PT End of Session - 12/28/17 1201    Visit Number  9    Number of Visits  13    Date for PT Re-Evaluation  01/05/18    PT Start Time  1016    PT Stop Time  1058    PT Time Calculation (min)  42 min    Activity Tolerance  Patient tolerated treatment well    Behavior During Therapy  [facility] for tasks assessed/performed       Past Medical History:  Diagnosis Date  . Anxiety   . Chronic pain   . High cholesterol   . Hypertension     Past Surgical History:  Procedure Laterality Date  . NO PAST SURGERIES      There were no vitals filed for this visit.  Subjective Assessment - 12/28/17 1039    Subjective  No pain today.  Has been doing exercises    Currently in Pain?  No/denies    Pain Score  0-No pain    Pain Location  Hip    Pain Relieving Factors  soft tissue work    Pain Score  0    Pain Location  Neck                      [facility] Adult PT Treatment/Exercise - 12/28/17 0001      Neck Exercises: Standing   Wall Push Ups  10 reps cued    Other Standing Exercises  back to wall to decrease lumbar curve ( wering 2-3 inch heels)  shoulder rows,  horizontal abduction and  diagonals all with cues      Knee/Hip Exercises: Stretches   Passive Hamstring Stretch  Both;30 seconds 3 reps  WNL    Piriformis Stretch  Both;30 seconds supine left tighter      Knee/Hip Exercises: Aerobic   Tread Mill  2 minutes    Nustep  5 min L7 UE & LE    Stepper  5 floors L2  cued,  limited by fatigue      Knee/Hip Exercises: Machines for Strengthening   Other Machine  --      Knee/Hip Exercises: Supine   Hip Adduction Isometric  10 reps tilt  with ball squeeze abdominal focus    Bridges  1 set;10 reps    Bridges with [employer]  2 sets;10 reps             PT Education - 12/28/17 1159    Education provided  Yes    Education Details  Exercise form technique,  posture ed for standing  heels  can increase low back curve      Person(s) Educated  Patient    Methods  Explanation;Demonstration;Tactile cues;Verbal cues    Comprehension  Verbalized understanding;Returned demonstration          PT Long Term Goals - 12/21/17 1220      PT LONG TERM GOAL #1   Title  Pt will verbalize average pain <=3/10 to decrease pain effects on daily activity    Baseline  no pain today.  Consistant    Time  6    Period  Weeks    Status  Partially Met  PT LONG TERM GOAL #2   Title  Gross UE & LE strength to 5/5 for proper support to biomechanical chain    Time  6    Period  Weeks    Status  Unable to assess      PT LONG TERM GOAL #3   Title  Pt will be able to return to use of home exercise equipment     Time  6    Period  Weeks    Status  Unable to assess      PT LONG TERM GOAL #4   Title  Resolution of HA pain    Baseline  no HA today  Consistant?    Time  6    Period  Weeks    Status  Partially Met            Plan - 12/28/17 1202    Clinical Impression Statement  No pain today.  patient has been using good techniques at home with ADL.  Core activation continue to be a challance as she fatigues,  body awareness of standing posture needed   when she is wearing heels.     PT Next Visit Plan  advance core strengthening HEP, review quadruped    PT Home Exercise Plan  scapular retraction, upper trap & levator stretch, LTR, post pelvic tilt with LE adduction, isometric Lt hip flexion; tennis ball 3-5 min; hooklying iso press into pillows, bridge with pillow squeeze, table top holds; seated HSS, piriformis stretchProne strength with bracing,  single arms/legs and opposite lifts; qped alt arm/leg lifts, clam & reverse     Consulted and Agree with Plan of Care  Patient       Patient will benefit from skilled therapeutic intervention in order to improve the following deficits and impairments:     Visit Diagnosis: Cervicalgia  Acute right-sided low back pain without sciatica  Acute pain of right shoulder  Pain in right leg     Problem List There are no active problems to display for this patient.   HARRIS,KAREN  PTA 12/28/2017, 12:05 PM  [facility] [address] [place], [place], [address] Phone: [phone]   Fax:  [phone]  Name: Misty Manning MRN: [id number] Date of Birth: Jan 17, 1961  PHYSICAL THERAPY DISCHARGE SUMMARY  Visits from Start of Care: 9  Current functional level related to goals / functional outcomes: See above   Remaining deficits: See above   Education / Equipment: Anatomy of condition, POC, HEP, exercise form/rationale  Plan: Patient agrees to discharge.  Patient goals were not met. Patient is being discharged due to not returning since the last visit.  ?????     Jessica C. Hightower PT, DPT 01/07/19 9:17 AM

## 2018-01-01 ENCOUNTER — Ambulatory Visit: Payer: Medicaid Other | Admitting: Physical Therapy

## 2018-01-01 ENCOUNTER — Telehealth: Payer: Self-pay | Admitting: Physical Therapy

## 2018-01-01 NOTE — Telephone Encounter (Signed)
Left message regarding NS and advised of next appointment time. Joesph Marcy C. Kyng Matlock PT, DPT 01/01/18 1:34 PM

## 2018-01-03 ENCOUNTER — Telehealth: Payer: Self-pay | Admitting: Physical Therapy

## 2018-01-03 ENCOUNTER — Ambulatory Visit: Payer: Medicaid Other | Admitting: Physical Therapy

## 2018-01-03 ENCOUNTER — Ambulatory Visit: Payer: Self-pay

## 2018-01-03 NOTE — Telephone Encounter (Signed)
Left message on preferred number- NS for visit today was supposed to be d/c visit. POC goes through Friday if she would like to schedule an appointment by 3/22. If I do not hear from her, the case will be d/c.  Phyliss Hulick C. Anu Stagner PT, DPT 01/03/18 1:09 PM

## 2018-02-16 ENCOUNTER — Ambulatory Visit: Payer: Self-pay

## 2018-09-30 IMAGING — CR DG HIP (WITH OR WITHOUT PELVIS) 2-3V*R*
3 series · 3 of 3 positions shown · non-contrast
Comparison: None.

CLINICAL DATA: Right hip pain after fall at school today.

EXAM:
DG HIP (WITH OR WITHOUT PELVIS) 2-3V RIGHT

[pelvis ap]
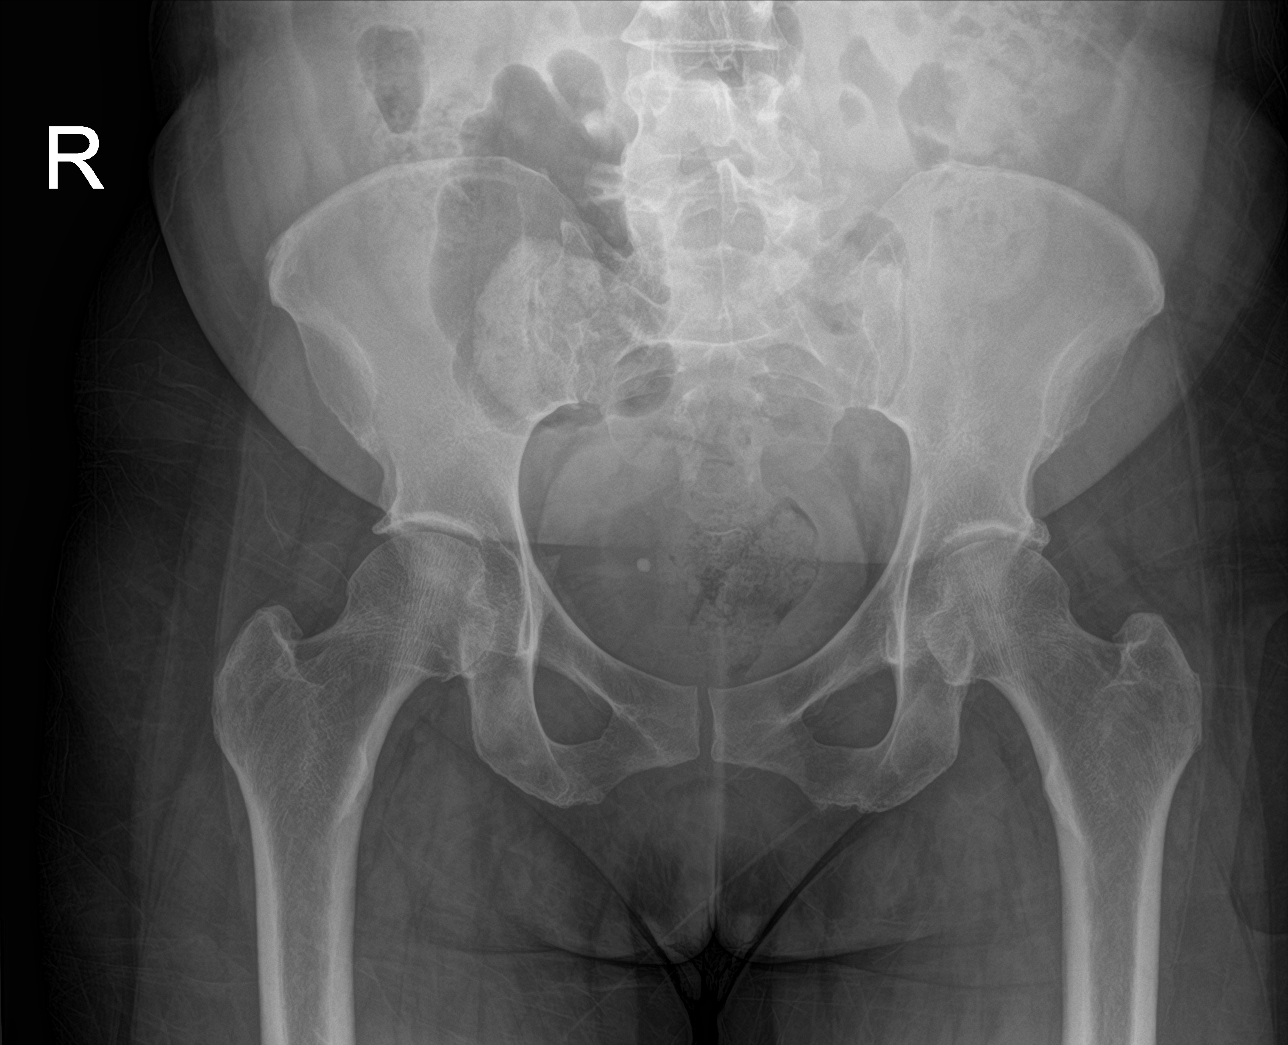

[hip ap]
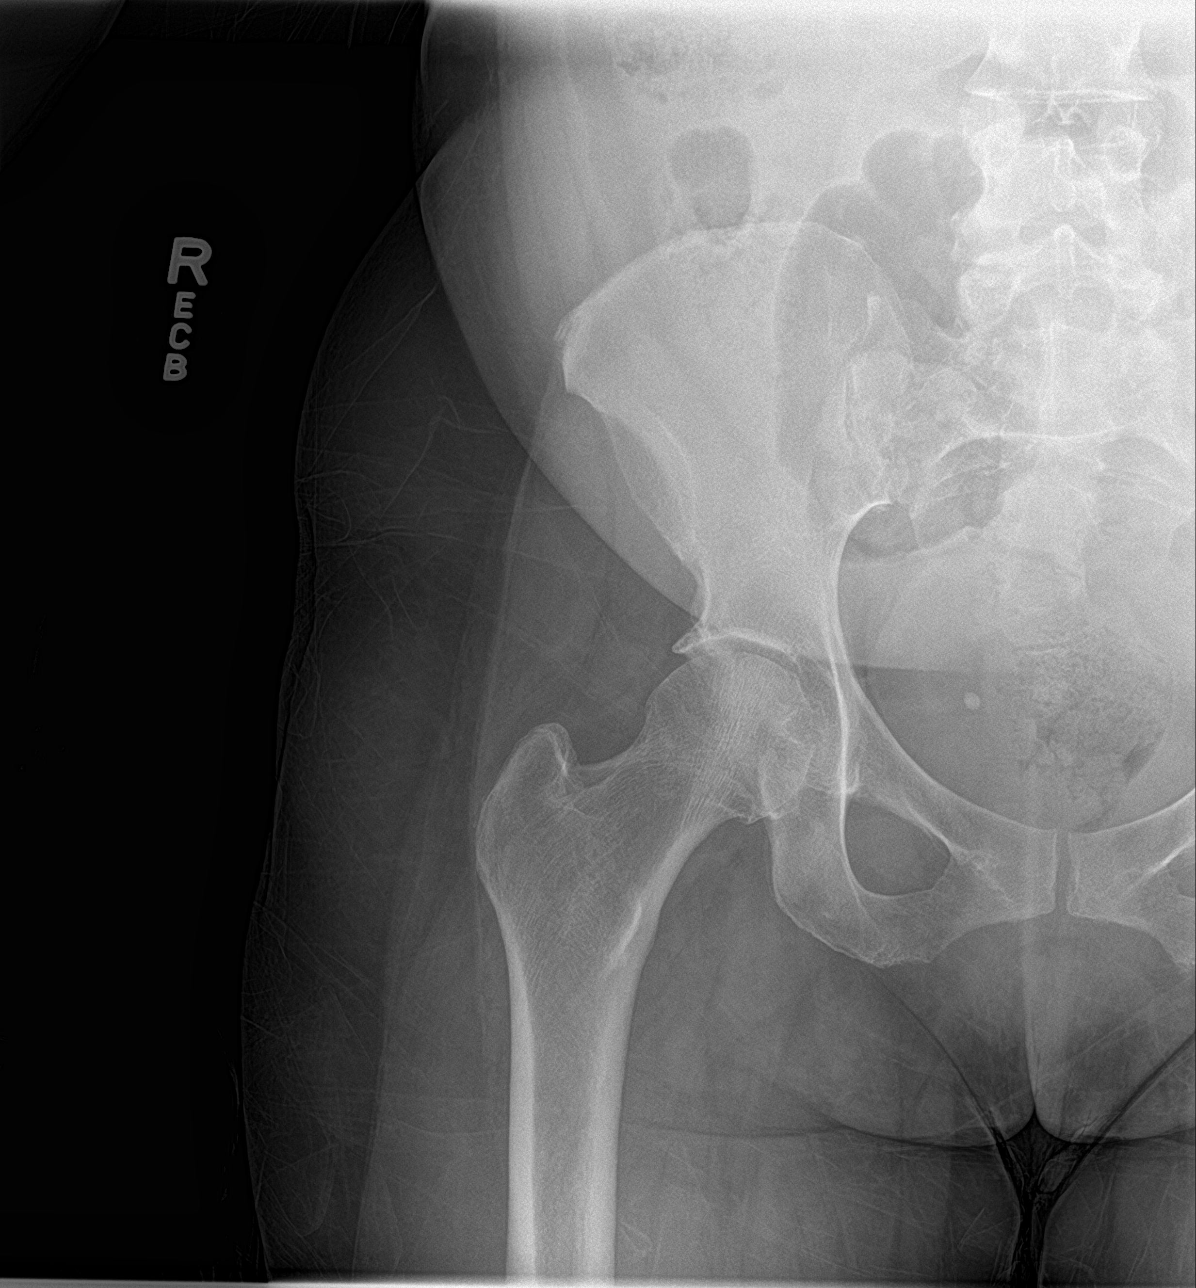

[hip lat]
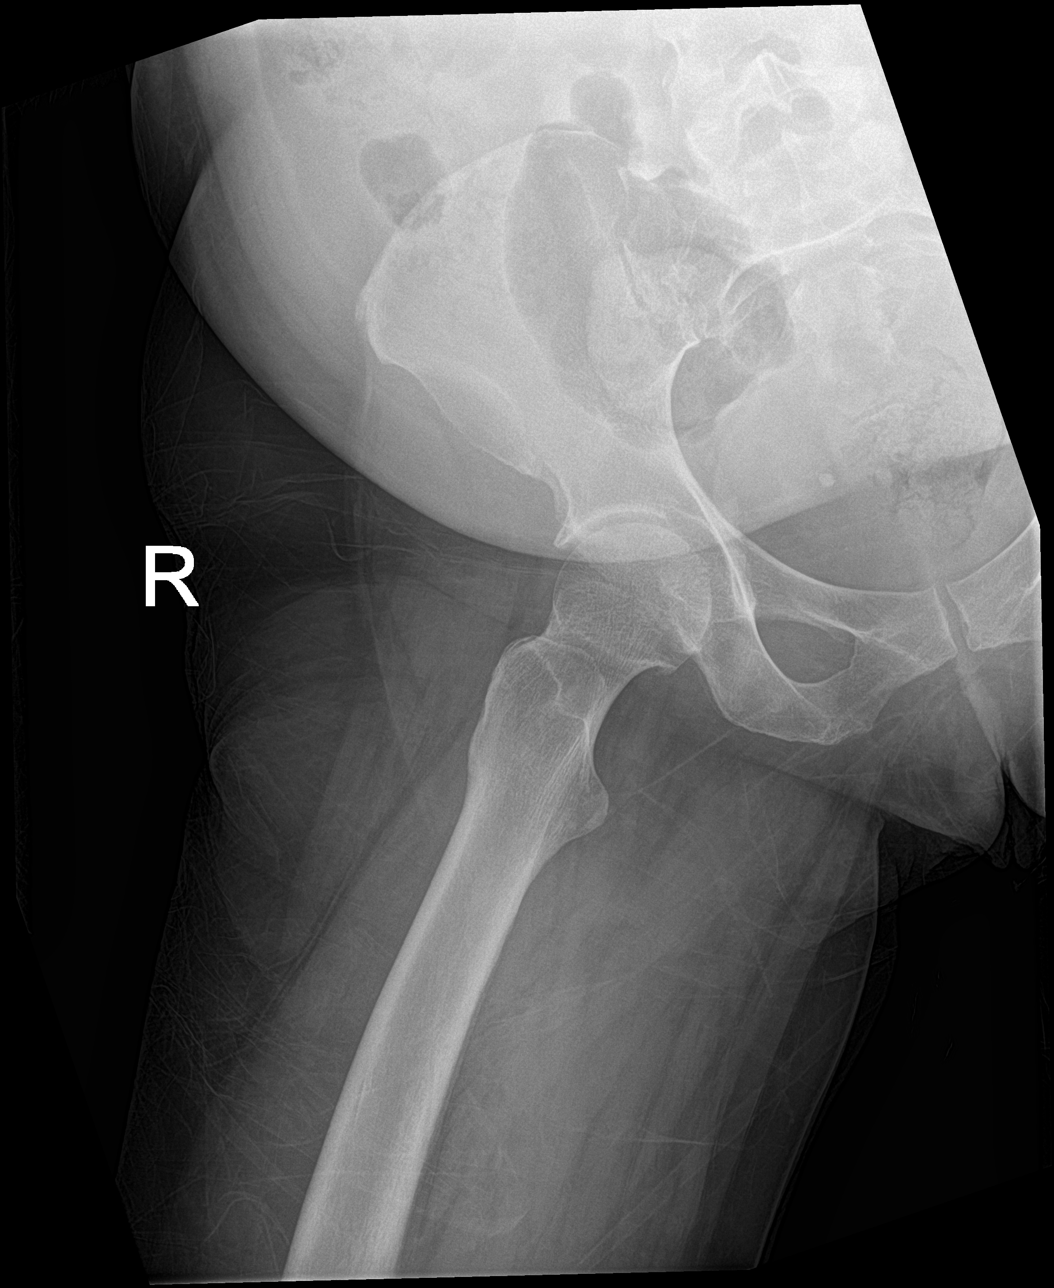

[3 of 3 positions shown; findings below may reference images not displayed]

FINDINGS: There is no fracture or dislocation. Small marginal osteophyte on
the inferior aspect of the femoral head. No joint space narrowing.
Soft tissues are normal.
IMPRESSION: No acute abnormality.  Slight arthritic changes.

## 2021-05-04 ENCOUNTER — Other Ambulatory Visit: Payer: Self-pay | Admitting: Internal Medicine

## 2021-05-05 ENCOUNTER — Other Ambulatory Visit: Payer: Self-pay | Admitting: Internal Medicine

## 2021-05-05 DIAGNOSIS — E2839 Other primary ovarian failure: Secondary | ICD-10-CM

## 2021-05-05 LAB — CBC
HCT: 39.5 % (ref 35.0–45.0)
Hemoglobin: 12.6 g/dL (ref 11.7–15.5)
MCH: 29.2 pg (ref 27.0–33.0)
MCHC: 31.9 g/dL — ABNORMAL LOW (ref 32.0–36.0)
MCV: 91.4 fL (ref 80.0–100.0)
MPV: 10.7 fL (ref 7.5–12.5)
Platelets: 303 10*3/uL (ref 140–400)
RBC: 4.32 10*6/uL (ref 3.80–5.10)
RDW: 12.5 % (ref 11.0–15.0)
WBC: 4.9 10*3/uL (ref 3.8–10.8)

## 2021-05-05 LAB — COMPLETE METABOLIC PANEL WITH GFR
AG Ratio: 1.8 (calc) (ref 1.0–2.5)
ALT: 12 U/L (ref 6–29)
AST: 14 U/L (ref 10–35)
Albumin: 4.2 g/dL (ref 3.6–5.1)
Alkaline phosphatase (APISO): 74 U/L (ref 37–153)
BUN: 9 mg/dL (ref 7–25)
CO2: 24 mmol/L (ref 20–32)
Calcium: 9.1 mg/dL (ref 8.6–10.4)
Chloride: 110 mmol/L (ref 98–110)
Creat: 0.85 mg/dL (ref 0.50–1.03)
Globulin: 2.3 g/dL (calc) (ref 1.9–3.7)
Glucose, Bld: 82 mg/dL (ref 65–99)
Potassium: 4.2 mmol/L (ref 3.5–5.3)
Sodium: 143 mmol/L (ref 135–146)
Total Bilirubin: 0.6 mg/dL (ref 0.2–1.2)
Total Protein: 6.5 g/dL (ref 6.1–8.1)
eGFR: 79 mL/min/{1.73_m2} (ref >=60)

## 2021-05-05 LAB — LIPID PANEL
Cholesterol: 216 mg/dL — ABNORMAL HIGH (ref <200)
HDL: 76 mg/dL (ref >=50)
LDL Cholesterol (Calc): 117 mg/dL (calc) — ABNORMAL HIGH
Non-HDL Cholesterol (Calc): 140 mg/dL (calc) — ABNORMAL HIGH (ref <130)
Total CHOL/HDL Ratio: 2.8 (calc) (ref <5.0)
Triglycerides: 120 mg/dL (ref <150)

## 2021-05-05 LAB — TSH: TSH: 1.12 mIU/L (ref 0.40–4.50)

## 2021-05-13 ENCOUNTER — Telehealth: Payer: Self-pay | Admitting: General Practice

## 2021-05-13 ENCOUNTER — Encounter: Payer: Self-pay | Admitting: General Practice

## 2021-05-13 NOTE — Telephone Encounter (Signed)
Left message on VM for patient to contact our office to schedule New Patient appointment in September.  Pt referred from [facility] for pap smear.

## 2023-06-05 ENCOUNTER — Other Ambulatory Visit: Payer: Self-pay | Admitting: Internal Medicine

## 2023-06-06 LAB — COMPLETE METABOLIC PANEL WITH GFR
AG Ratio: 2 (calc) (ref 1.0–2.5)
ALT: 16 U/L (ref 6–29)
AST: 19 U/L (ref 10–35)
Albumin: 3.8 g/dL (ref 3.6–5.1)
Alkaline phosphatase (APISO): 77 U/L (ref 37–153)
BUN/Creatinine Ratio: 8 (calc) (ref 6–22)
BUN: 10 mg/dL (ref 7–25)
CO2: 22 mmol/L (ref 20–32)
Calcium: 9.2 mg/dL (ref 8.6–10.4)
Chloride: 113 mmol/L — ABNORMAL HIGH (ref 98–110)
Creat: 1.28 mg/dL — ABNORMAL HIGH (ref 0.50–1.05)
Globulin: 1.9 g/dL (ref 1.9–3.7)
Glucose, Bld: 83 mg/dL (ref 65–99)
Potassium: 4.5 mmol/L (ref 3.5–5.3)
Sodium: 145 mmol/L (ref 135–146)
Total Bilirubin: 0.3 mg/dL (ref 0.2–1.2)
Total Protein: 5.7 g/dL — ABNORMAL LOW (ref 6.1–8.1)
eGFR: 48 mL/min/{1.73_m2} — ABNORMAL LOW (ref >=60)

## 2023-06-06 LAB — URINE CULTURE

## 2023-06-06 LAB — CBC
HCT: 41.4 % (ref 35.0–45.0)
Hemoglobin: 13.4 g/dL (ref 11.7–15.5)
MCH: 29.3 pg (ref 27.0–33.0)
MCHC: 32.4 g/dL (ref 32.0–36.0)
MCV: 90.4 fL (ref 80.0–100.0)
MPV: 11.3 fL (ref 7.5–12.5)
Platelets: 330 10*3/uL (ref 140–400)
RBC: 4.58 10*6/uL (ref 3.80–5.10)
RDW: 12.4 % (ref 11.0–15.0)
WBC: 6.7 10*3/uL (ref 3.8–10.8)

## 2023-06-06 LAB — LIPID PANEL
Cholesterol: 225 mg/dL — ABNORMAL HIGH (ref <200)
HDL: 68 mg/dL (ref >=50)
LDL Cholesterol (Calc): 127 mg/dL — ABNORMAL HIGH
Non-HDL Cholesterol (Calc): 157 mg/dL (calc) — ABNORMAL HIGH (ref <130)
Total CHOL/HDL Ratio: 3.3 (calc) (ref <5.0)
Triglycerides: 189 mg/dL — ABNORMAL HIGH (ref <150)

## 2023-06-06 LAB — TSH: TSH: 1.34 m[IU]/L (ref 0.40–4.50)

## 2023-06-06 LAB — VITAMIN D 25 HYDROXY (VIT D DEFICIENCY, FRACTURES): Vit D, 25-Hydroxy: 28 ng/mL — ABNORMAL LOW (ref 30–100)

## 2024-04-15 ENCOUNTER — Other Ambulatory Visit: Payer: Self-pay | Admitting: Internal Medicine

## 2024-04-15 DIAGNOSIS — Z1231 Encounter for screening mammogram for malignant neoplasm of breast: Secondary | ICD-10-CM

## 2024-04-18 ENCOUNTER — Ambulatory Visit

## 2024-05-02 ENCOUNTER — Inpatient Hospital Stay: Admission: RE | Admit: 2024-05-02 | Source: Ambulatory Visit

## 2024-05-15 ENCOUNTER — Telehealth: Payer: Self-pay

## 2024-05-15 NOTE — Telephone Encounter (Signed)
 Called the pt was unable to leave a message due to both numbers stating that call was not able to go through.
